# Patient Record
Sex: Male | Born: 1970 | Race: Black or African American | Hispanic: No | Smoking: Current every day smoker
Health system: Southern US, Community
[De-identification: ages and names within clinical notes are randomized; demographics above are authoritative.]

## PROBLEM LIST (undated history)

## (undated) DIAGNOSIS — I10 Essential (primary) hypertension: Secondary | ICD-10-CM

## (undated) HISTORY — PX: APPENDECTOMY: SHX54

---

## 2007-03-09 ENCOUNTER — Ambulatory Visit (HOSPITAL_COMMUNITY): Admission: RE | Admit: 2007-03-09 | Discharge: 2007-03-09 | Payer: Self-pay | Admitting: Orthopaedic Surgery

## 2013-01-29 ENCOUNTER — Emergency Department (HOSPITAL_COMMUNITY): Payer: Self-pay | Admitting: Anesthesiology

## 2013-01-29 ENCOUNTER — Encounter (HOSPITAL_COMMUNITY)
Admission: EM | Disposition: A | Discharge status: Home / Self Care | Payer: Self-pay | Source: Home / Self Care | Attending: Emergency Medicine

## 2013-01-29 ENCOUNTER — Encounter (HOSPITAL_COMMUNITY): Payer: Self-pay | Admitting: Anesthesiology

## 2013-01-29 ENCOUNTER — Emergency Department (HOSPITAL_COMMUNITY): Payer: Self-pay

## 2013-01-29 ENCOUNTER — Observation Stay (HOSPITAL_COMMUNITY)
Admission: EM | Admit: 2013-01-29 | Discharge: 2013-01-30 | Disposition: A | Discharge status: Home / Self Care | Payer: Self-pay | Attending: General Surgery | Admitting: General Surgery

## 2013-01-29 ENCOUNTER — Encounter (HOSPITAL_COMMUNITY): Payer: Self-pay | Admitting: Emergency Medicine

## 2013-01-29 DIAGNOSIS — K3580 Unspecified acute appendicitis: Principal | ICD-10-CM | POA: Insufficient documentation

## 2013-01-29 DIAGNOSIS — Z01812 Encounter for preprocedural laboratory examination: Secondary | ICD-10-CM | POA: Insufficient documentation

## 2013-01-29 DIAGNOSIS — K37 Unspecified appendicitis: Secondary | ICD-10-CM

## 2013-01-29 HISTORY — PX: LAPAROSCOPIC APPENDECTOMY: SHX408

## 2013-01-29 LAB — COMPREHENSIVE METABOLIC PANEL
Albumin: 3.7 g/dL (ref 3.5–5.2)
BUN: 9 mg/dL (ref 6–23)
Chloride: 98 mEq/L (ref 96–112)
Creatinine, Ser: 0.9 mg/dL (ref 0.50–1.35)
GFR calc Af Amer: >90 mL/min (ref >90)
Glucose, Bld: 98 mg/dL (ref 70–99)
Total Bilirubin: 0.6 mg/dL (ref 0.3–1.2)
Total Protein: 6.9 g/dL (ref 6.0–8.3)

## 2013-01-29 LAB — URINALYSIS, ROUTINE W REFLEX MICROSCOPIC
Bilirubin Urine: NEGATIVE
Ketones, ur: NEGATIVE mg/dL
Leukocytes, UA: NEGATIVE
Nitrite: NEGATIVE
Urobilinogen, UA: 0.2 mg/dL (ref 0.0–1.0)
pH: 6.5 (ref 5.0–8.0)

## 2013-01-29 LAB — CBC WITH DIFFERENTIAL/PLATELET
Basophils Relative: 1 % (ref 0–1)
Eosinophils Absolute: 0.1 10*3/uL (ref 0.0–0.7)
HCT: 44.8 % (ref 39.0–52.0)
Hemoglobin: 16.1 g/dL (ref 13.0–17.0)
Lymphs Abs: 1.5 10*3/uL (ref 0.7–4.0)
MCH: 29.7 pg (ref 26.0–34.0)
MCHC: 35.9 g/dL (ref 30.0–36.0)
Monocytes Absolute: 0.7 10*3/uL (ref 0.1–1.0)
Monocytes Relative: 8 % (ref 3–12)
RBC: 5.43 MIL/uL (ref 4.22–5.81)

## 2013-01-29 LAB — LIPASE, BLOOD: Lipase: 31 U/L (ref 11–59)

## 2013-01-29 LAB — URINE MICROSCOPIC-ADD ON

## 2013-01-29 SURGERY — APPENDECTOMY, LAPAROSCOPIC
Anesthesia: General | Site: Abdomen

## 2013-01-29 MED ORDER — ONDANSETRON HCL 4 MG/2ML IJ SOLN
INTRAMUSCULAR | Status: DC | PRN
  Administered 2013-01-29: 4 mg via INTRAVENOUS

## 2013-01-29 MED ORDER — MORPHINE SULFATE 4 MG/ML IJ SOLN
4.0000 mg | Freq: Once | INTRAMUSCULAR | Status: AC
  Administered 2013-01-29: 4 mg via INTRAVENOUS
  Filled 2013-01-29: qty 1

## 2013-01-29 MED ORDER — FENTANYL CITRATE 0.05 MG/ML IJ SOLN
INTRAMUSCULAR | Status: AC
  Filled 2013-01-29: qty 5

## 2013-01-29 MED ORDER — LACTATED RINGERS IV SOLN
INTRAVENOUS | Status: DC | PRN
  Administered 2013-01-29: 20:00:00 via INTRAVENOUS

## 2013-01-29 MED ORDER — MIDAZOLAM HCL 2 MG/2ML IJ SOLN
INTRAMUSCULAR | Status: AC
  Filled 2013-01-29: qty 2

## 2013-01-29 MED ORDER — ROCURONIUM BROMIDE 100 MG/10ML IV SOLN
INTRAVENOUS | Status: DC | PRN
  Administered 2013-01-29: 30 mg via INTRAVENOUS

## 2013-01-29 MED ORDER — ENOXAPARIN SODIUM 40 MG/0.4ML ~~LOC~~ SOLN
40.0000 mg | SUBCUTANEOUS | Status: DC
  Administered 2013-01-29: 40 mg via SUBCUTANEOUS
  Filled 2013-01-29: qty 0.4

## 2013-01-29 MED ORDER — IOHEXOL 300 MG/ML  SOLN
100.0000 mL | Freq: Once | INTRAMUSCULAR | Status: AC | PRN
  Administered 2013-01-29: 100 mL via INTRAVENOUS

## 2013-01-29 MED ORDER — METRONIDAZOLE IN NACL 5-0.79 MG/ML-% IV SOLN: 500.0000 mg | INTRAVENOUS | Status: DC

## 2013-01-29 MED ORDER — SIMETHICONE 80 MG PO CHEW: 80.0000 mg | CHEWABLE_TABLET | Freq: Four times a day (QID) | ORAL | Status: DC | PRN

## 2013-01-29 MED ORDER — ONDANSETRON HCL 4 MG/2ML IJ SOLN: 4.0000 mg | Freq: Four times a day (QID) | INTRAMUSCULAR | Status: DC | PRN

## 2013-01-29 MED ORDER — KETOROLAC TROMETHAMINE 30 MG/ML IJ SOLN
INTRAMUSCULAR | Status: AC
  Filled 2013-01-29: qty 1

## 2013-01-29 MED ORDER — CIPROFLOXACIN IN D5W 400 MG/200ML IV SOLN
INTRAVENOUS | Status: AC
  Filled 2013-01-29: qty 200

## 2013-01-29 MED ORDER — MIDAZOLAM HCL 5 MG/5ML IJ SOLN
INTRAMUSCULAR | Status: DC | PRN
  Administered 2013-01-29 (×2): 1 mg via INTRAVENOUS

## 2013-01-29 MED ORDER — POVIDONE-IODINE 10 % EX OINT
TOPICAL_OINTMENT | CUTANEOUS | Status: AC
  Filled 2013-01-29: qty 1

## 2013-01-29 MED ORDER — GLYCOPYRROLATE 0.2 MG/ML IJ SOLN
INTRAMUSCULAR | Status: AC
  Filled 2013-01-29: qty 2

## 2013-01-29 MED ORDER — ONDANSETRON HCL 4 MG PO TABS: 4.0000 mg | ORAL_TABLET | Freq: Four times a day (QID) | ORAL | Status: DC | PRN

## 2013-01-29 MED ORDER — GLYCOPYRROLATE 0.2 MG/ML IJ SOLN
INTRAMUSCULAR | Status: DC | PRN
  Administered 2013-01-29: 0.2 mg via INTRAVENOUS
  Administered 2013-01-29: 0.4 mg via INTRAVENOUS

## 2013-01-29 MED ORDER — GLYCOPYRROLATE 0.2 MG/ML IJ SOLN
INTRAMUSCULAR | Status: AC
  Filled 2013-01-29: qty 1

## 2013-01-29 MED ORDER — CIPROFLOXACIN IN D5W 400 MG/200ML IV SOLN: 400.0000 mg | INTRAVENOUS | Status: DC

## 2013-01-29 MED ORDER — POVIDONE-IODINE 10 % OINT PACKET
TOPICAL_OINTMENT | CUTANEOUS | Status: DC | PRN
  Administered 2013-01-29: 1 via TOPICAL

## 2013-01-29 MED ORDER — LACTATED RINGERS IV SOLN
INTRAVENOUS | Status: DC
  Administered 2013-01-29: 23:00:00 via INTRAVENOUS

## 2013-01-29 MED ORDER — METRONIDAZOLE IN NACL 5-0.79 MG/ML-% IV SOLN
500.0000 mg | Freq: Three times a day (TID) | INTRAVENOUS | Status: DC
  Administered 2013-01-30: 500 mg via INTRAVENOUS
  Filled 2013-01-29 (×2): qty 100

## 2013-01-29 MED ORDER — LIDOCAINE HCL (PF) 1 % IJ SOLN
INTRAMUSCULAR | Status: AC
  Filled 2013-01-29: qty 5

## 2013-01-29 MED ORDER — BUPIVACAINE HCL (PF) 0.5 % IJ SOLN
INTRAMUSCULAR | Status: DC | PRN
  Administered 2013-01-29: 10 mL

## 2013-01-29 MED ORDER — FENTANYL CITRATE 0.05 MG/ML IJ SOLN
INTRAMUSCULAR | Status: DC | PRN
  Administered 2013-01-29 (×2): 50 ug via INTRAVENOUS
  Administered 2013-01-29: 25 ug via INTRAVENOUS
  Administered 2013-01-29: 50 ug via INTRAVENOUS
  Administered 2013-01-29: 25 ug via INTRAVENOUS
  Administered 2013-01-29: 50 ug via INTRAVENOUS

## 2013-01-29 MED ORDER — 0.9 % SODIUM CHLORIDE (POUR BTL) OPTIME
TOPICAL | Status: DC | PRN
  Administered 2013-01-29: 1000 mL

## 2013-01-29 MED ORDER — KETOROLAC TROMETHAMINE 30 MG/ML IJ SOLN
30.0000 mg | Freq: Once | INTRAMUSCULAR | Status: AC
  Administered 2013-01-29: 30 mg via INTRAVENOUS

## 2013-01-29 MED ORDER — CIPROFLOXACIN IN D5W 400 MG/200ML IV SOLN
400.0000 mg | Freq: Two times a day (BID) | INTRAVENOUS | Status: DC
  Administered 2013-01-30: 400 mg via INTRAVENOUS
  Filled 2013-01-29: qty 200

## 2013-01-29 MED ORDER — ROCURONIUM BROMIDE 50 MG/5ML IV SOLN
INTRAVENOUS | Status: AC
  Filled 2013-01-29: qty 1

## 2013-01-29 MED ORDER — CIPROFLOXACIN IN D5W 400 MG/200ML IV SOLN
INTRAVENOUS | Status: AC
  Administered 2013-01-29: 400 mg
  Filled 2013-01-29: qty 200

## 2013-01-29 MED ORDER — PROPOFOL 10 MG/ML IV BOLUS
INTRAVENOUS | Status: DC | PRN
  Administered 2013-01-29: 140 mg via INTRAVENOUS

## 2013-01-29 MED ORDER — NEOSTIGMINE METHYLSULFATE 1 MG/ML IJ SOLN
INTRAMUSCULAR | Status: DC | PRN
  Administered 2013-01-29: 2 mg via INTRAVENOUS

## 2013-01-29 MED ORDER — IOHEXOL 300 MG/ML  SOLN
50.0000 mL | Freq: Once | INTRAMUSCULAR | Status: AC | PRN
  Administered 2013-01-29: 50 mL via ORAL

## 2013-01-29 MED ORDER — OXYCODONE-ACETAMINOPHEN 5-325 MG PO TABS
1.0000 | ORAL_TABLET | ORAL | Status: DC | PRN
  Administered 2013-01-30: 1 via ORAL
  Filled 2013-01-29: qty 1

## 2013-01-29 MED ORDER — HYDROMORPHONE HCL PF 1 MG/ML IJ SOLN
1.0000 mg | INTRAMUSCULAR | Status: DC | PRN
Start: 2013-01-29 — End: 2013-01-30

## 2013-01-29 MED ORDER — LIDOCAINE HCL (CARDIAC) 10 MG/ML IV SOLN
INTRAVENOUS | Status: DC | PRN
  Administered 2013-01-29: 10 mg via INTRAVENOUS

## 2013-01-29 MED ORDER — METRONIDAZOLE IN NACL 5-0.79 MG/ML-% IV SOLN
INTRAVENOUS | Status: AC
  Administered 2013-01-29: 1000 mg
  Filled 2013-01-29: qty 100

## 2013-01-29 MED ORDER — METRONIDAZOLE IN NACL 5-0.79 MG/ML-% IV SOLN
INTRAVENOUS | Status: AC
  Filled 2013-01-29: qty 100

## 2013-01-29 MED ORDER — BUPIVACAINE HCL (PF) 0.5 % IJ SOLN
INTRAMUSCULAR | Status: AC
  Filled 2013-01-29: qty 30

## 2013-01-29 MED ORDER — ACETAMINOPHEN 500 MG PO TABS
1000.0000 mg | ORAL_TABLET | Freq: Four times a day (QID) | ORAL | Status: DC
  Administered 2013-01-29 – 2013-01-30 (×2): 1000 mg via ORAL
  Filled 2013-01-29 (×2): qty 2

## 2013-01-29 MED ORDER — PROPOFOL 10 MG/ML IV EMUL
INTRAVENOUS | Status: AC
  Filled 2013-01-29: qty 20

## 2013-01-29 MED ORDER — ONDANSETRON HCL 4 MG/2ML IJ SOLN
INTRAMUSCULAR | Status: AC
  Filled 2013-01-29: qty 2

## 2013-01-29 MED ORDER — ONDANSETRON HCL 4 MG/2ML IJ SOLN
4.0000 mg | Freq: Once | INTRAMUSCULAR | Status: AC
  Administered 2013-01-29: 4 mg via INTRAVENOUS
  Filled 2013-01-29: qty 2

## 2013-01-29 SURGICAL SUPPLY — 46 items
BAG HAMPER (MISCELLANEOUS) ×2 IMPLANT
CLOTH BEACON ORANGE TIMEOUT ST (SAFETY) ×2 IMPLANT
COVER LIGHT HANDLE STERIS (MISCELLANEOUS) ×4 IMPLANT
CUTTER FLEX LINEAR 45M (STAPLE) IMPLANT
CUTTER LINEAR ENDO 35 ETS (STAPLE) IMPLANT
CUTTER LINEAR ENDO 35 ETS TH (STAPLE) ×2 IMPLANT
DECANTER SPIKE VIAL GLASS SM (MISCELLANEOUS) ×2 IMPLANT
DISSECTOR BLUNT TIP ENDO 5MM (MISCELLANEOUS) IMPLANT
DURAPREP 26ML APPLICATOR (WOUND CARE) ×2 IMPLANT
ELECT REM PT RETURN 9FT ADLT (ELECTROSURGICAL) ×2
ELECTRODE REM PT RTRN 9FT ADLT (ELECTROSURGICAL) ×1 IMPLANT
FILTER SMOKE EVAC LAPAROSHD (FILTER) ×2 IMPLANT
FORMALIN 10 PREFIL 120ML (MISCELLANEOUS) ×2 IMPLANT
GLOVE BIO SURGEON STRL SZ7.5 (GLOVE) ×2 IMPLANT
GLOVE BIOGEL PI IND STRL 7.0 (GLOVE) ×2 IMPLANT
GLOVE BIOGEL PI INDICATOR 7.0 (GLOVE) ×2
GLOVE EXAM NITRILE MD LF STRL (GLOVE) ×2 IMPLANT
GLOVE OPTIFIT SS 6.5 STRL BRWN (GLOVE) ×2 IMPLANT
GOWN STRL REIN XL XLG (GOWN DISPOSABLE) ×4 IMPLANT
INST SET LAPROSCOPIC AP (KITS) ×2 IMPLANT
IV NS IRRIG 3000ML ARTHROMATIC (IV SOLUTION) IMPLANT
KIT ROOM TURNOVER APOR (KITS) ×2 IMPLANT
MANIFOLD NEPTUNE II (INSTRUMENTS) ×2 IMPLANT
NEEDLE INSUFFLATION 14GA 120MM (NEEDLE) ×2 IMPLANT
NS IRRIG 1000ML POUR BTL (IV SOLUTION) ×2 IMPLANT
PACK LAP CHOLE LZT030E (CUSTOM PROCEDURE TRAY) ×2 IMPLANT
PAD ARMBOARD 7.5X6 YLW CONV (MISCELLANEOUS) ×2 IMPLANT
PENCIL HANDSWITCHING (ELECTRODE) ×2 IMPLANT
POUCH SPECIMEN RETRIEVAL 10MM (ENDOMECHANICALS) ×2 IMPLANT
RELOAD /EVU35 (ENDOMECHANICALS) IMPLANT
RELOAD 45 VASCULAR/THIN (ENDOMECHANICALS) IMPLANT
RELOAD CUTTER ETS 35MM STAND (ENDOMECHANICALS) IMPLANT
SCALPEL HARMONIC ACE (MISCELLANEOUS) ×2 IMPLANT
SET BASIN LINEN APH (SET/KITS/TRAYS/PACK) ×2 IMPLANT
SET TUBE IRRIG SUCTION NO TIP (IRRIGATION / IRRIGATOR) IMPLANT
SPONGE GAUZE 2X2 8PLY STRL LF (GAUZE/BANDAGES/DRESSINGS) ×2 IMPLANT
STAPLER VISISTAT (STAPLE) ×2 IMPLANT
SUT VICRYL 0 UR6 27IN ABS (SUTURE) ×2 IMPLANT
TAPE CLOTH SURG 4X10 WHT LF (GAUZE/BANDAGES/DRESSINGS) ×2 IMPLANT
TRAY FOLEY CATH 16FR SILVER (SET/KITS/TRAYS/PACK) ×2 IMPLANT
TROCAR ENDO BLADELESS 11MM (ENDOMECHANICALS) ×2 IMPLANT
TROCAR ENDO BLADELESS 12MM (ENDOMECHANICALS) ×2 IMPLANT
TROCAR XCEL NON-BLD 5MMX100MML (ENDOMECHANICALS) ×2 IMPLANT
TUBING INSUFFLATION (TUBING) ×2 IMPLANT
WARMER LAPAROSCOPE (MISCELLANEOUS) ×2 IMPLANT
YANKAUER SUCT 12FT TUBE ARGYLE (SUCTIONS) ×2 IMPLANT

## 2013-01-29 NOTE — ED Provider Notes (Signed)
CSN: [id number]     Arrival date & time 01/29/13  1617 History   First MD Initiated Contact with Patient 01/29/13 1637     Chief Complaint  Patient presents with  . Abdominal Pain   (Consider location/radiation/quality/duration/timing/severity/associated sxs/prior Treatment) HPI Comments: Patient presents to the ER for evaluation of abdominal pain. Patient reports that the pain began 2 days ago. Initially it was diffuse in the upper. He reports that he felt very bloated and has been taking gas relief medication without improvement. No nausea, vomiting or diarrhea. He has not had a normal bowel movement in several days. Patient denies fever. Patient reports that the pain worsened today and now is more in the lower abdomen. It is constant and moderate to severe. He has not identified alleviating or exacerbating factors.  Patient is a 42 y.o. male presenting with abdominal pain.  Abdominal Pain Associated symptoms: no fever     History reviewed. No pertinent past medical history. History reviewed. No pertinent past surgical history. No family history on file. History  Substance Use Topics  . Smoking status: Current Every Day Smoker  . Smokeless tobacco: Not on file  . Alcohol Use: No    Review of Systems  Constitutional: Negative for fever.  Gastrointestinal: Positive for abdominal pain and abdominal distention.  All other systems reviewed and are negative.    Allergies  Penicillins  Home Medications   Current Outpatient Rx  Name  Route  Sig  Dispense  Refill  . simethicone (GAS RELIEF EXTRA STRENGTH) 125 MG chewable tablet   Oral   Chew 125 mg by mouth every 6 (six) hours as needed for flatulence (and/or stomach pain).          BP 123/84  Pulse 94  Temp(Src) 98.3 F (36.8 C) (Oral)  Resp 20  Ht 5\' 7"  (1.702 m)  Wt 140 lb (63.504 kg)  BMI 21.92 kg/m2  SpO2 100% Physical Exam  Constitutional: He is oriented to person, place, and time. He appears well-developed and  well-nourished. No distress.  HENT:  Head: Normocephalic and atraumatic.  Right Ear: Hearing normal.  Left Ear: Hearing normal.  Nose: Nose normal.  Mouth/Throat: Oropharynx is clear and moist and mucous membranes are normal.  Eyes: Conjunctivae and EOM are normal. Pupils are equal, round, and reactive to light.  Neck: Normal range of motion. Neck supple.  Cardiovascular: Regular rhythm, S1 normal and S2 normal.  Exam reveals no gallop and no friction rub.   No murmur heard. Pulmonary/Chest: Effort normal and breath sounds normal. No respiratory distress. He exhibits no tenderness.  Abdominal: Soft. Normal appearance and bowel sounds are normal. There is no hepatosplenomegaly. There is tenderness. There is no rebound, no guarding, no tenderness at McBurney's point and negative Murphy's sign. No hernia.  Tenderness is diffuse, the patient has increased tenderness to palpation in the right lower quadrant  Musculoskeletal: Normal range of motion.  Neurological: He is alert and oriented to person, place, and time. He has normal strength. No cranial nerve deficit or sensory deficit. Coordination normal. GCS eye subscore is 4. GCS verbal subscore is 5. GCS motor subscore is 6.  Skin: Skin is warm, dry and intact. No rash noted. No cyanosis.  Psychiatric: He has a normal mood and affect. His speech is normal and behavior is normal. Thought content normal.    ED Course  Procedures (including critical care time) Labs Review Labs Reviewed  URINALYSIS, ROUTINE W REFLEX MICROSCOPIC - Abnormal; Notable for the following:  Hgb urine dipstick TRACE (*)    All other components within normal limits  CBC WITH DIFFERENTIAL  COMPREHENSIVE METABOLIC PANEL  LIPASE, BLOOD  URINE MICROSCOPIC-ADD ON   Imaging Review Ct Abdomen Pelvis W Contrast  01/29/2013   CLINICAL DATA:  Abdominal pain, right lower quadrant pain.  EXAM: CT ABDOMEN AND PELVIS WITH CONTRAST  TECHNIQUE: Multidetector CT imaging of the  abdomen and pelvis was performed using the standard protocol following bolus administration of intravenous contrast.  CONTRAST:  50mL OMNIPAQUE IOHEXOL 300 MG/ML SOLN, OMNIPAQUE IOHEXOL 300 MG/ML SOLN  COMPARISON:  None.  FINDINGS: Lung bases are clear. No effusions. Heart is normal size.  Liver, gallbladder, spleen, pancreas, adrenals and kidneys are unremarkable.  The appendix is dilated with mucosal enhancement. Diameter is approximately 10 mm. Findings compatible with acute appendicitis. The cecum is mildly dilated with air and located near the midline underlying the umbilicus. Terminal ileum unremarkable. No free fluid, free air or adenopathy. Urinary bladder and prostate grossly unremarkable. Aorta is normal caliber.  No acute bony abnormality.  IMPRESSION: Dilated appendix with mucosal enhancement compatible with acute appendicitis.   Electronically Signed   By: Charlett Nose M.D.   On: 01/29/2013 18:44    EKG Interpretation   None       MDM  Diagnosis: Acute Appendicitis  Patient presents to the ER for evaluation of abdominal pain for two days. It started as upper and S., has become worse today. He did have some mild diffuse abdominal tenderness, but did have focal tenderness and guarding in the right lower quadrant. Labs were unremarkable. CT scan was performed and does show dilated appendix with mucosal enhancement consistent with acute appendicitis. Consult to Doctor Lovell Sheehan to General surgery for admission and surgery.    Gilda Crease, MD 01/29/13 [phone]

## 2013-01-29 NOTE — Transfer of Care (Signed)
Immediate Anesthesia Transfer of Care Note  Patient: Casey Yang  Procedure(s) Performed: Procedure(s) (LRB): APPENDECTOMY LAPAROSCOPIC (N/A)  Patient Location: PACU  Anesthesia Type: General  Level of Consciousness: awake  Airway & Oxygen Therapy: Patient Spontanous Breathing and non-rebreather face mask  Post-op Assessment: Report given to PACU RN, Post -op Vital signs reviewed and stable and Patient moving all extremities  Post vital signs: Reviewed and stable  Complications: No apparent anesthesia complications

## 2013-01-29 NOTE — Anesthesia Preprocedure Evaluation (Signed)
Anesthesia Evaluation  Patient identified by MRN, date of birth, ID band Patient awake    Reviewed: Allergy & Precautions, H&P , NPO status , Patient's Chart, lab work & pertinent test results  Airway Mallampati: II TM Distance: >3 FB Neck ROM: Full    Dental  (+) Teeth Intact and Missing   Pulmonary    Pulmonary exam normal       Cardiovascular Exercise Tolerance: Good Rhythm:Regular     Neuro/Psych    GI/Hepatic Patient received Oral Contrast Agents,Right lower quadrant pain x 48 hours   Endo/Other    Renal/GU      Musculoskeletal   Abdominal Normal abdominal exam  (+)   Peds  Hematology   Anesthesia Other Findings   Reproductive/Obstetrics                           Anesthesia Physical Anesthesia Plan  ASA: I and emergent  Anesthesia Plan: General   Post-op Pain Management:    Induction: Intravenous, Rapid sequence and Cricoid pressure planned  Airway Management Planned: Oral ETT  Additional Equipment:   Intra-op Plan:   Post-operative Plan: Extubation in OR  Informed Consent:   Dental advisory given  Plan Discussed with: Anesthesiologist and Surgeon  Anesthesia Plan Comments:         Anesthesia Quick Evaluation

## 2013-01-29 NOTE — ED Notes (Signed)
Patient to or

## 2013-01-29 NOTE — H&P (Signed)
Casey Yang is an 42 y.o. male.   Chief Complaint: Abdominal pain HPI: Patient is a 42 year old white male who presents with a two-day history of worsening abdominal pain. CT scan the abdomen reveals acute appendicitis.  History reviewed. No pertinent past medical history.  History reviewed. No pertinent past surgical history.  No family history on file. Social History:  reports that he has been smoking.  He does not have any smokeless tobacco history on file. He reports that he does not drink alcohol or use illicit drugs.  Allergies:  Allergies  Allergen Reactions  . Penicillins Nausea And Vomiting     (Not in a hospital admission)  Results for orders placed during the hospital encounter of 01/29/13 (from the past 48 hour(s))  CBC WITH DIFFERENTIAL     Status: None   Collection Time    01/29/13  5:16 PM      Result Value Range   WBC 8.8  4.0 - 10.5 K/uL   RBC 5.43  4.22 - 5.81 MIL/uL   Hemoglobin 16.1  13.0 - 17.0 g/dL   HCT 16.1  09.6 - 04.5 %   MCV 82.5  78.0 - 100.0 fL   MCH 29.7  26.0 - 34.0 pg   MCHC 35.9  30.0 - 36.0 g/dL   RDW 40.9  81.1 - 91.4 %   Platelets 182  150 - 400 K/uL   Neutrophils Relative % 73  43 - 77 %   Neutro Abs 6.4  1.7 - 7.7 K/uL   Lymphocytes Relative 17  12 - 46 %   Lymphs Abs 1.5  0.7 - 4.0 K/uL   Monocytes Relative 8  3 - 12 %   Monocytes Absolute 0.7  0.1 - 1.0 K/uL   Eosinophils Relative 2  0 - 5 %   Eosinophils Absolute 0.1  0.0 - 0.7 K/uL   Basophils Relative 1  0 - 1 %   Basophils Absolute 0.1  0.0 - 0.1 K/uL  COMPREHENSIVE METABOLIC PANEL     Status: None   Collection Time    01/29/13  5:16 PM      Result Value Range   Sodium 139  135 - 145 mEq/L   Potassium 4.0  3.5 - 5.1 mEq/L   Chloride 98  96 - 112 mEq/L   CO2 26  19 - 32 mEq/L   Glucose, Bld 98  70 - 99 mg/dL   BUN 9  6 - 23 mg/dL   Creatinine, Ser 7.82  0.50 - 1.35 mg/dL   Calcium 9.6  8.4 - 95.6 mg/dL   Total Protein 6.9  6.0 - 8.3 g/dL   Albumin 3.7  3.5 - 5.2  g/dL   AST 28  0 - 37 U/L   Comment: SLIGHT HEMOLYSIS   ALT 19  0 - 53 U/L   Alkaline Phosphatase 64  39 - 117 U/L   Total Bilirubin 0.6  0.3 - 1.2 mg/dL   GFR calc non Af Amer >90  >90 mL/min   GFR calc Af Amer >90  >90 mL/min   Comment: (NOTE)     The eGFR has been calculated using the CKD EPI equation.     This calculation has not been validated in all clinical situations.     eGFR's persistently <90 mL/min signify possible Chronic Kidney     Disease.  LIPASE, BLOOD     Status: None   Collection Time    01/29/13  5:16 PM  Result Value Range   Lipase 31  11 - 59 U/L  URINALYSIS, ROUTINE W REFLEX MICROSCOPIC     Status: Abnormal   Collection Time    01/29/13  5:21 PM      Result Value Range   Color, Urine YELLOW  YELLOW   APPearance CLEAR  CLEAR   Specific Gravity, Urine 1.010  1.005 - 1.030   pH 6.5  5.0 - 8.0   Glucose, UA NEGATIVE  NEGATIVE mg/dL   Hgb urine dipstick TRACE (*) NEGATIVE   Bilirubin Urine NEGATIVE  NEGATIVE   Ketones, ur NEGATIVE  NEGATIVE mg/dL   Protein, ur NEGATIVE  NEGATIVE mg/dL   Urobilinogen, UA 0.2  0.0 - 1.0 mg/dL   Nitrite NEGATIVE  NEGATIVE   Leukocytes, UA NEGATIVE  NEGATIVE  URINE MICROSCOPIC-ADD ON     Status: None   Collection Time    01/29/13  5:21 PM      Result Value Range   Squamous Epithelial / LPF RARE  RARE   WBC, UA 0-2  <3 WBC/hpf   RBC / HPF 0-2  <3 RBC/hpf   Bacteria, UA RARE  RARE   Ct Abdomen Pelvis W Contrast  01/29/2013   CLINICAL DATA:  Abdominal pain, right lower quadrant pain.  EXAM: CT ABDOMEN AND PELVIS WITH CONTRAST  TECHNIQUE: Multidetector CT imaging of the abdomen and pelvis was performed using the standard protocol following bolus administration of intravenous contrast.  CONTRAST:  50mL OMNIPAQUE IOHEXOL 300 MG/ML SOLN, OMNIPAQUE IOHEXOL 300 MG/ML SOLN  COMPARISON:  None.  FINDINGS: Lung bases are clear. No effusions. Heart is normal size.  Liver, gallbladder, spleen, pancreas, adrenals and kidneys are  unremarkable.  The appendix is dilated with mucosal enhancement. Diameter is approximately 10 mm. Findings compatible with acute appendicitis. The cecum is mildly dilated with air and located near the midline underlying the umbilicus. Terminal ileum unremarkable. No free fluid, free air or adenopathy. Urinary bladder and prostate grossly unremarkable. Aorta is normal caliber.  No acute bony abnormality.  IMPRESSION: Dilated appendix with mucosal enhancement compatible with acute appendicitis.   Electronically Signed   By: Charlett Nose M.D.   On: 01/29/2013 18:44    Review of Systems  Constitutional: Positive for malaise/fatigue.  Respiratory: Negative.   Cardiovascular: Negative.   Gastrointestinal: Positive for abdominal pain.  Genitourinary: Negative.   Musculoskeletal: Negative.   All other systems reviewed and are negative.    Blood pressure 140/94, pulse 82, temperature 98.6 F (37 C), temperature source Oral, resp. rate 18, height 5\' 7"  (1.702 m), weight 63.504 kg (140 lb), SpO2 100.00%. Physical Exam  Vitals reviewed. Constitutional: He appears well-developed and well-nourished.  HENT:  Head: Normocephalic and atraumatic.  Neck: Normal range of motion. Neck supple.  Cardiovascular: Normal rate, regular rhythm and normal heart sounds.   Respiratory: Effort normal and breath sounds normal.  GI: Soft. There is tenderness. There is no rebound.  Tender in the right lower corner to deep palpation. No rigidity noted.  Neurological: He is alert.  Skin: Skin is warm and dry.     Assessment/Plan Impression: Acute appendicitis Plan: Patient be taken to the operating room for laparoscopic appendectomy. The risks and benefits of the procedure including bleeding, infection, and the possibility of an open procedure were fully explained to the patient, who gave informed consent.  Macklin Jacquin A 01/29/2013, 7:53 PM

## 2013-01-29 NOTE — Anesthesia Postprocedure Evaluation (Signed)
Anesthesia Post Note  Patient: Casey Yang  Procedure(s) Performed: Procedure(s) (LRB): APPENDECTOMY LAPAROSCOPIC (N/A)  Anesthesia type: General  Patient location: PACU  Post pain: Pain level controlled  Post assessment: Post-op Vital signs reviewed, Patient's Cardiovascular Status Stable, Respiratory Function Stable, Patent Airway, No signs of Nausea or vomiting and Pain level controlled  Last Vitals:  Filed Vitals:   01/29/13 2130  BP: 128/76  Pulse: 61  Temp:   Resp: 10    Post vital signs: Reviewed and stable  Level of consciousness: awake and alert   Complications: No apparent anesthesia complications

## 2013-01-29 NOTE — Op Note (Signed)
Patient:  Casey Yang  DOB:  07/28/1970  MRN:  [id number]   Preop Diagnosis:  Acute appendicitis  Postop Diagnosis:  Same  Procedure:  Laparoscopic appendectomy  Surgeon:  Franky Macho, M.D.  Anes:  General endotracheal  Indications:  Patient is a 42 year old black male who presents with acute appendicitis. The risks and benefits of the procedure including bleeding, infection, and the possibility of an open procedure were fully explained to the patient, who gave informed consent.  Procedure note:  The patient is placed the supine position. After induction of general endotracheal anesthesia, the abdomen was prepped and draped using usual sterile technique with DuraPrep. Surgical site confirmation was performed.  A supraumbilical incision was made down to the fascia. A Veress needle was introduced into the abdominal cavity and confirmation of placement was done using the saline drop test. The abdomen was then insufflated to 16 mm mercury pressure. An 11 mm trocar was introduced into the abdominal cavity under direct visualization without difficulty. Patient was placed in deeper Trendelenburg position and an additional 12 mm trocar was placed the suprapubic region. A 5 mm trocar was placed left lower corner region. The appendix was visualized and noted to be inflamed. The descending colon was noted to be mildly dilated as was seen on CAT scan. The mesoappendix was divided using the harmonic scalpel. A vascular Endo GIA was placed across the base the appendix and fired. The appendix was then removed using an Endo Catch bag and sent to pathology further examination. The staple line was inspected and noted within normal limits. The descending colon was inspected and there was no evidence of injury. All fluid and air were then evacuated from the abdominal cavity prior to removal of the trochars.  All wounds were irrigated with normal saline. Of note was the fact that there was subcutaneous air  present around the umbilical trocar site where air was insufflated. This was expressed through the trocar sites. All wounds were injected with 0.5% Sensorcaine. The supraumbilical fascia as well as suprapubic fascia were reapproximated using 0 Vicryl interrupted sutures. All skin incisions were closed using staples. Betadine ointment and dry sterile dressings were applied.  All tape and needle counts were correct at the end of the procedure. Patient was extubated in the operating room and transferred to PACU in stable condition.  Complications:  None  EBL:  Minimal  Specimen:  Appendix

## 2013-01-29 NOTE — ED Notes (Signed)
Pt c/o abd pain since Sunday evening.  Denies n/v/d.  Reports has been taking otc gas relief tablets.  LBM was 2 or 3 days ago.

## 2013-01-29 NOTE — Anesthesia Procedure Notes (Signed)
Procedure Name: Intubation Date/Time: 01/29/2013 8:22 PM Performed by: Franco Nones Pre-anesthesia Checklist: Patient identified, Patient being monitored, Timeout performed, Emergency Drugs available and Suction available Patient Re-evaluated:Patient Re-evaluated prior to inductionOxygen Delivery Method: Circle System Utilized Preoxygenation: Pre-oxygenation with 100% oxygen Intubation Type: IV induction, Rapid sequence and Cricoid Pressure applied Ventilation: Mask ventilation without difficulty Laryngoscope Size: Miller and 2 Grade View: Grade I Tube type: Oral Tube size: 7.0 mm Number of attempts: 1 Airway Equipment and Method: stylet Placement Confirmation: ETT inserted through vocal cords under direct vision,  positive ETCO2 and breath sounds checked- equal and bilateral Secured at: 21 cm Tube secured with: Tape Dental Injury: Teeth and Oropharynx as per pre-operative assessment

## 2013-01-30 ENCOUNTER — Encounter (HOSPITAL_COMMUNITY): Payer: Self-pay | Admitting: General Surgery

## 2013-01-30 LAB — BASIC METABOLIC PANEL
BUN: 9 mg/dL (ref 6–23)
CO2: 32 mEq/L (ref 19–32)
Calcium: 8.5 mg/dL (ref 8.4–10.5)
Chloride: 99 mEq/L (ref 96–112)
Creatinine, Ser: 1.16 mg/dL (ref 0.50–1.35)
Sodium: 138 mEq/L (ref 135–145)

## 2013-01-30 LAB — CBC
HCT: 41 % (ref 39.0–52.0)
MCH: 29.1 pg (ref 26.0–34.0)
MCHC: 34.4 g/dL (ref 30.0–36.0)
MCV: 84.5 fL (ref 78.0–100.0)
Platelets: 159 10*3/uL (ref 150–400)
RBC: 4.85 MIL/uL (ref 4.22–5.81)
RDW: 14.1 % (ref 11.5–15.5)

## 2013-01-30 MED ORDER — OXYCODONE-ACETAMINOPHEN 7.5-325 MG PO TABS: 1.0000 | ORAL_TABLET | ORAL | Status: DC | PRN

## 2013-01-30 NOTE — Discharge Summary (Signed)
Physician Discharge Summary  Patient ID: Casey Yang MRN: [id number] DOB/AGE: 02-11-1970 42 y.o.  Admit date: 01/29/2013 Discharge date: 01/30/2013  Admission Diagnoses: Acute appendicitis  Discharge Diagnoses: Same Active Problems:   Acute appendicitis   Discharged Condition: good  Hospital Course: Patient is a 42 year old black male who presented emergency room with a two-day history of lower abdominal pain. CT scan the abdomen revealed acute appendicitis. He was taken to the operating room on 01/29/2013 and underwent laparoscopic appendectomy. Tolerated the procedure well. His postoperative course has been unremarkable. His diet was advanced without difficulty. He is being discharged home on postoperative day one in good improving condition.  Treatments: surgery: Laparoscopic appendectomy on 01/29/2013  Discharge Exam: Blood pressure 107/80, pulse 61, temperature 97.8 F (36.6 C), temperature source Oral, resp. rate 19, height 5\' 7"  (1.702 m), weight 63.504 kg (140 lb), SpO2 100.00%. General appearance: alert, cooperative and no distress Resp: clear to auscultation bilaterally Cardio: regular rate and rhythm, S1, S2 normal, no murmur, click, rub or gallop GI: Soft. Dressings dry and intact.  Disposition: Home    Medication List         GAS RELIEF EXTRA STRENGTH 125 MG chewable tablet  Generic drug:  simethicone  Chew 125 mg by mouth every 6 (six) hours as needed for flatulence (and/or stomach pain).     oxyCODONE-acetaminophen 7.5-325 MG per tablet  Commonly known as:  PERCOCET  Take 1-2 tablets by mouth every 4 (four) hours as needed.           Follow-up Information   Follow up with Dalia Heading, MD. Schedule an appointment as soon as possible for a visit on 02/06/2013.   Specialty:  General Surgery   Contact information:   [address] [place] [address] [phone]       Signed: Franky Macho A 01/30/2013, 8:09 AM

## 2013-01-30 NOTE — Progress Notes (Signed)
Patient d/c home with prescriptions and work note. IV cath removed and intact. No pain/swelling at site. No drainage/ bleeding from puncture sites. Patient ambulatory and awaiting for ride home.

## 2015-05-16 ENCOUNTER — Emergency Department (HOSPITAL_COMMUNITY)
Admission: EM | Admit: 2015-05-16 | Discharge: 2015-05-16 | Disposition: A | Discharge status: Home / Self Care | Payer: Self-pay | Attending: Emergency Medicine | Admitting: Emergency Medicine

## 2015-05-16 ENCOUNTER — Encounter (HOSPITAL_COMMUNITY): Payer: Self-pay | Admitting: Emergency Medicine

## 2015-05-16 DIAGNOSIS — F17210 Nicotine dependence, cigarettes, uncomplicated: Secondary | ICD-10-CM | POA: Insufficient documentation

## 2015-05-16 DIAGNOSIS — L259 Unspecified contact dermatitis, unspecified cause: Secondary | ICD-10-CM | POA: Insufficient documentation

## 2015-05-16 DIAGNOSIS — L237 Allergic contact dermatitis due to plants, except food: Secondary | ICD-10-CM

## 2015-05-16 MED ORDER — PREDNISONE 50 MG PO TABS
60.0000 mg | ORAL_TABLET | Freq: Once | ORAL | Status: AC
  Administered 2015-05-16: 60 mg via ORAL
  Filled 2015-05-16: qty 1

## 2015-05-16 MED ORDER — ONDANSETRON HCL 4 MG PO TABS
4.0000 mg | ORAL_TABLET | Freq: Once | ORAL | Status: AC
  Administered 2015-05-16: 4 mg via ORAL
  Filled 2015-05-16: qty 1

## 2015-05-16 MED ORDER — TRIAMCINOLONE ACETONIDE 0.1 % EX CREA: 1.0000 "application " | TOPICAL_CREAM | Freq: Two times a day (BID) | CUTANEOUS | Status: DC

## 2015-05-16 MED ORDER — DIPHENHYDRAMINE HCL 25 MG PO TABS
ORAL_TABLET | ORAL | Status: DC
Start: 2015-05-16 — End: 2017-12-27

## 2015-05-16 MED ORDER — DEXAMETHASONE 4 MG PO TABS: 4.0000 mg | ORAL_TABLET | Freq: Two times a day (BID) | ORAL | Status: DC

## 2015-05-16 MED ORDER — LORATADINE 10 MG PO TABS
10.0000 mg | ORAL_TABLET | Freq: Once | ORAL | Status: AC
  Administered 2015-05-16: 10 mg via ORAL
  Filled 2015-05-16: qty 1

## 2015-05-16 NOTE — ED Notes (Signed)
PT c/o poison oak reported rash x2 days after contact with plant.

## 2015-05-16 NOTE — ED Provider Notes (Signed)
CSN: [id number]     Arrival date & time 05/16/15  1639 History   First MD Initiated Contact with Patient 05/16/15 1725     Chief Complaint  Patient presents with  . Poison Oak     (Consider location/radiation/quality/duration/timing/severity/associated sxs/prior Treatment) Patient is a 45 y.o. male presenting with rash. The history is provided by the patient.  Rash Location:  Hand and shoulder/arm Shoulder/arm rash location:  L arm and R arm Hand rash location:  R hand and L hand Quality: blistering and itchiness   Quality: not draining   Severity:  Moderate Onset quality:  Gradual Duration:  2 days Timing:  Intermittent Progression:  Unchanged Context: plant contact   Relieved by:  Nothing Ineffective treatments:  None tried Associated symptoms: no abdominal pain, no joint pain, no shortness of breath, no sore throat, no throat swelling, no tongue swelling, not vomiting and not wheezing     History reviewed. No pertinent past medical history. Past Surgical History  Procedure Laterality Date  . Laparoscopic appendectomy N/A 01/29/2013    Procedure: APPENDECTOMY LAPAROSCOPIC;  Surgeon: Dalia HeadingMark A Jenkins, MD;  Location: [facility];  Service: General;  Laterality: N/A;  . Appendectomy     History reviewed. No pertinent family history. Social History  Substance Use Topics  . Smoking status: Current Every Day Smoker -- 1.00 packs/day    Types: Cigarettes  . Smokeless tobacco: None  . Alcohol Use: No    Review of Systems  Constitutional: Negative for activity change.       All ROS Neg except as noted in HPI  HENT: Negative for nosebleeds and sore throat.   Eyes: Negative for photophobia and discharge.  Respiratory: Negative for cough, shortness of breath and wheezing.   Cardiovascular: Negative for chest pain and palpitations.  Gastrointestinal: Negative for vomiting, abdominal pain and blood in stool.  Genitourinary: Negative for dysuria, frequency and hematuria.   Musculoskeletal: Negative for back pain, arthralgias and neck pain.  Skin: Positive for rash.  Neurological: Negative for dizziness, seizures and speech difficulty.  Psychiatric/Behavioral: Negative for hallucinations and confusion.      Allergies  Penicillins  Home Medications   Prior to Admission medications   Medication Sig Start Date End Date Taking? Authorizing Provider  oxyCODONE-acetaminophen (PERCOCET) 7.5-325 MG per tablet Take 1-2 tablets by mouth every 4 (four) hours as needed. 01/30/13   Franky MachoMark Jenkins, MD  simethicone (GAS RELIEF EXTRA STRENGTH) 125 MG chewable tablet Chew 125 mg by mouth every 6 (six) hours as needed for flatulence (and/or stomach pain).    Historical Provider, MD   BP 121/80 mmHg  Pulse 82  Temp(Src) 98.2 F (36.8 C) (Oral)  Resp 14  Ht 5\' 7"  (1.702 m)  Wt 63.504 kg  BMI 21.92 kg/m2  SpO2 100% Physical Exam  Constitutional: He is oriented to person, place, and time. He appears well-developed and well-nourished.  Non-toxic appearance.  HENT:  Head: Normocephalic.  Right Ear: Tympanic membrane and external ear normal.  Left Ear: Tympanic membrane and external ear normal.  Airway patent.  Eyes: EOM and lids are normal. Pupils are equal, round, and reactive to light.  Neck: Normal range of motion. Neck supple. Carotid bruit is not present.  Cardiovascular: Normal rate, regular rhythm, normal heart sounds, intact distal pulses and normal pulses.   Pulmonary/Chest: Breath sounds normal. No respiratory distress.  Abdominal: Soft. Bowel sounds are normal. There is no tenderness. There is no guarding.  Musculoskeletal: Normal range of motion.  Lymphadenopathy:  Head (right side): No submandibular adenopathy present.       Head (left side): No submandibular adenopathy present.    He has no cervical adenopathy.  Neurological: He is alert and oriented to person, place, and time. He has normal strength. No cranial nerve deficit or sensory deficit.   Skin: Skin is warm and dry. Rash noted.  Blistering rash with red base on the hands and both arms. One area on the helix of the right ear.  Psychiatric: He has a normal mood and affect. His speech is normal.  Nursing note and vitals reviewed.   ED Course  Procedures (including critical care time) Labs Review Labs Reviewed - No data to display  Imaging Review No results found. I have personally reviewed and evaluated these images and lab results as part of my medical decision-making.   EKG Interpretation None      MDM Rash is consistent with contact dermatitis.  Rx for decadron, benadryl, and triamcinolone given to the patient. Pt in agreement with this discharge plan.   Final diagnoses:  Contact dermatitis due to poison oak    **I have reviewed nursing notes, vital signs, and all appropriate lab and imaging results for this patient.Ivery Quale, PA-C 05/16/15 1746  Bethann Berkshire, MD 05/16/15 [phone]

## 2015-05-16 NOTE — Discharge Instructions (Signed)
Use decadron and triamcinolone daily. Use claritin or allegra for itching during the day. Use benadryl at bedtime,or when you are not driving, operating machines, or participating in activities requiring concentration. Contact Dermatitis Dermatitis is redness, soreness, and swelling (inflammation) of the skin. Contact dermatitis is a reaction to certain substances that touch the skin. You either touched something that irritated your skin, or you have allergies to something you touched.  HOME CARE  Skin Care  Moisturize your skin as needed.  Apply cool compresses to the affected areas.   Try taking a bath with:   Epsom salts. Follow the instructions on the package. You can get these at a pharmacy or grocery store.   Baking soda. Pour a small amount into the bath as told by your doctor.   Colloidal oatmeal. Follow the instructions on the package. You can get this at a pharmacy or grocery store.   Try applying baking soda paste to your skin. Stir water into baking soda until it looks like paste.  Do not scratch your skin.   Bathe less often.  Bathe in lukewarm water. Avoid using hot water.  Medicines  Take or apply over-the-counter and prescription medicines only as told by your doctor.   If you were prescribed an antibiotic medicine, take or apply your antibiotic as told by your doctor. Do not stop taking the antibiotic even if your condition starts to get better. General Instructions  Keep all follow-up visits as told by your doctor. This is important.   Avoid the substance that caused your reaction. If you do not know what caused it, keep a journal to try to track what caused it. Write down:   What you eat.   What cosmetic products you use.   What you drink.   What you wear in the affected area. This includes jewelry.   If you were given a bandage (dressing), take care of it as told by your doctor. This includes when to change and remove it.  GET HELP IF:     You do not get better with treatment.   Your condition gets worse.   You have signs of infection such as:  Swelling.  Tenderness.  Redness.  Soreness.  Warmth.   You have a fever.   You have new symptoms.  GET HELP RIGHT AWAY IF:   You have a very bad headache.  You have neck pain.  Your neck is stiff.   You throw up (vomit).   You feel very sleepy.   You see red streaks coming from the affected area.   Your bone or joint underneath the affected area becomes painful after the skin has healed.   The affected area turns darker.   You have trouble breathing.    This information is not intended to replace advice given to you by your health care provider. Make sure you discuss any questions you have with your health care provider.   Document Released: 11/22/2008 Document Revised: 10/16/2014 Document Reviewed: 06/12/2014 Elsevier Interactive Patient Education [employer]2016 [employer]

## 2017-12-27 ENCOUNTER — Encounter: Payer: Self-pay | Admitting: Family

## 2017-12-27 ENCOUNTER — Ambulatory Visit (INDEPENDENT_AMBULATORY_CARE_PROVIDER_SITE_OTHER): Payer: Managed Care, Other (non HMO) | Admitting: Family

## 2017-12-27 ENCOUNTER — Ambulatory Visit (INDEPENDENT_AMBULATORY_CARE_PROVIDER_SITE_OTHER): Payer: Managed Care, Other (non HMO)

## 2017-12-27 VITALS — BP 133/86 | HR 75 | Temp 97.5°F | Ht 65.0 in | Wt 137.0 lb

## 2017-12-27 DIAGNOSIS — F17200 Nicotine dependence, unspecified, uncomplicated: Secondary | ICD-10-CM

## 2017-12-27 DIAGNOSIS — M542 Cervicalgia: Secondary | ICD-10-CM

## 2017-12-27 DIAGNOSIS — S161XXA Strain of muscle, fascia and tendon at neck level, initial encounter: Secondary | ICD-10-CM | POA: Diagnosis not present

## 2017-12-27 MED ORDER — BACLOFEN 10 MG PO TABS: 10.0000 mg | ORAL_TABLET | Freq: Three times a day (TID) | ORAL | 0 refills | Status: DC

## 2017-12-27 MED ORDER — DICLOFENAC SODIUM 75 MG PO TBEC: 75.0000 mg | DELAYED_RELEASE_TABLET | Freq: Two times a day (BID) | ORAL | 0 refills | Status: DC

## 2017-12-27 NOTE — Patient Instructions (Signed)

## 2017-12-27 NOTE — Addendum Note (Signed)
Addended by: Jannifer RodneyHAWKS, Harl Wiechmann A on: 12/27/2017 10:02 AM   Modules accepted: Orders

## 2017-12-27 NOTE — Progress Notes (Addendum)
Subjective:    Patient ID: Casey Yang, male    DOB: Apr 03, 1970, 47 y.o.   MRN: [id number]  Chief Complaint  Patient presents with  . pain supine neck at spine  . New Patient (Initial Visit)   Pt presents to the office today to establish care and complaints of neck pain. States this neck pain for the last month that is gradually worsening.   He states he does not go to the doctor unless he has too and has never had a PCP.  Neck Pain   This is a new problem. The current episode started more than 1 month ago. The problem occurs constantly. The problem has been gradually worsening. The pain is associated with nothing. The pain is present in the midline. The quality of the pain is described as aching. The pain is at a severity of 8/10. The pain is moderate. The symptoms are aggravated by bending. Pertinent negatives include no fever, headaches, leg pain, paresis, photophobia, visual change or weakness. He has tried acetaminophen and NSAIDs for the symptoms. The treatment provided mild relief.      Review of Systems  Constitutional: Negative for fever.  Eyes: Negative for photophobia.  Musculoskeletal: Positive for neck pain.  Neurological: Negative for weakness and headaches.  All other systems reviewed and are negative.   Family History  Problem Relation Age of Onset  . Cancer Mother        stomach  . Heart disease Father        Heart attack   Social History   Socioeconomic History  . Marital status: Divorced    Spouse name: Not on file  . Number of children: Not on file  . Years of education: Not on file  . Highest education level: Not on file  Occupational History  . Not on file  Social Needs  . Financial resource strain: Not on file  . Food insecurity:    Worry: Not on file    Inability: Not on file  . Transportation needs:    Medical: Not on file    Non-medical: Not on file  Tobacco Use  . Smoking status: Current Every Day Smoker    Packs/day: 1.00   Types: Cigarettes  . Smokeless tobacco: Current User    Types: Snuff  Substance and Sexual Activity  . Alcohol use: No  . Drug use: No  . Sexual activity: Not on file  Lifestyle  . Physical activity:    Days per week: Not on file    Minutes per session: Not on file  . Stress: Not on file  Relationships  . Social connections:    Talks on phone: Not on file    Gets together: Not on file    Attends religious service: Not on file    Active member of club or organization: Not on file    Attends meetings of clubs or organizations: Not on file    Relationship status: Not on file  Other Topics Concern  . Not on file  Social History Narrative  . Not on file       Objective:   Physical Exam  Constitutional: He is oriented to person, place, and time. He appears well-developed and well-nourished. No distress.  HENT:  Head: Normocephalic.  Right Ear: External ear normal.  Left Ear: External ear normal.  Mouth/Throat: Oropharynx is clear and moist.  Eyes: Pupils are equal, round, and reactive to light. Right eye exhibits no discharge. Left eye exhibits no discharge.  Neck: Normal range of motion. Neck supple. No thyromegaly present.  Cardiovascular: Normal rate, regular rhythm, normal heart sounds and intact distal pulses.  No murmur heard. Pulmonary/Chest: Effort normal and breath sounds normal. No respiratory distress. He has no wheezes.  Abdominal: Soft. Bowel sounds are normal. He exhibits no distension. There is no tenderness.  Musculoskeletal: Normal range of motion. He exhibits no edema or tenderness.  Full ROM of neck, slight pain with flexion.  Neurological: He is alert and oriented to person, place, and time. He has normal reflexes. No cranial nerve deficit.  Skin: Skin is warm and dry. No rash noted. No erythema.  Psychiatric: He has a normal mood and affect. His behavior is normal. Judgment and thought content normal.  Vitals reviewed.   X-ray- Negative, Preliminary  reading by Jannifer Rodneyhristy Caysen Whang, FNP [facility]   BP 133/86   Pulse 75   Temp (!) 97.5 F (36.4 C) (Oral)   Ht 5\' 5"  (1.651 m)   Wt 137 lb (62.1 kg)   BMI 22.80 kg/m        Kristen LoaderJames H Regula comes in today with chief complaint of pain supine neck at spine and New Patient (Initial Visit)   Diagnosis and orders addressed:  1. Neck pain - DG Cervical Spine Complete; Future -BMP pending  2. Current smoker Smoking cessation discussed  3. Strain of neck muscle, initial encounter Rest Ice ROM exercises discussed RTO if symptoms worsen or do not improve - diclofenac (VOLTAREN) 75 MG EC tablet; Take 1 tablet (75 mg total) by mouth 2 (two) times daily.  Dispense: 30 tablet; Refill: 0 - baclofen (LIORESAL) 10 MG tablet; Take 1 tablet (10 mg total) by mouth 3 (three) times daily.  Dispense: 30 each; Refill: 0 BMP   Jannifer Rodneyhristy Landry Kamath, FNP

## 2018-01-26 ENCOUNTER — Encounter: Payer: Self-pay | Admitting: Family

## 2018-01-26 ENCOUNTER — Ambulatory Visit (INDEPENDENT_AMBULATORY_CARE_PROVIDER_SITE_OTHER): Payer: Managed Care, Other (non HMO) | Admitting: Family

## 2018-01-26 VITALS — BP 156/89 | HR 66 | Temp 98.9°F | Ht 65.0 in | Wt 141.2 lb

## 2018-01-26 DIAGNOSIS — M542 Cervicalgia: Secondary | ICD-10-CM

## 2018-01-26 DIAGNOSIS — F17200 Nicotine dependence, unspecified, uncomplicated: Secondary | ICD-10-CM | POA: Insufficient documentation

## 2018-01-26 DIAGNOSIS — Z0001 Encounter for general adult medical examination with abnormal findings: Secondary | ICD-10-CM | POA: Diagnosis not present

## 2018-01-26 DIAGNOSIS — Z0000 Encounter for general adult medical examination without abnormal findings: Secondary | ICD-10-CM

## 2018-01-26 MED ORDER — BACLOFEN 10 MG PO TABS: 10.0000 mg | ORAL_TABLET | Freq: Three times a day (TID) | ORAL | 3 refills | Status: DC

## 2018-01-26 MED ORDER — DICLOFENAC SODIUM 75 MG PO TBEC: 75.0000 mg | DELAYED_RELEASE_TABLET | Freq: Two times a day (BID) | ORAL | 2 refills | Status: DC

## 2018-01-26 NOTE — Patient Instructions (Signed)

## 2018-01-26 NOTE — Progress Notes (Signed)
Subjective:    Patient ID: Casey Yang, male    DOB: January 22, 1971, 47 y.o.   MRN: [id number]  No chief complaint on file.  Pt presents to the office today for CPE. PT currently not taking any medications at this time. States he continues to have neck pain. States the diclofenac and baclofen did not help.  Neck Pain   This is a recurrent problem. The current episode started more than 1 month ago. The problem occurs intermittently. The problem has been waxing and waning. The pain is associated with nothing. The quality of the pain is described as aching. The pain is at a severity of 7/10. The pain is mild. The pain is same all the time. Pertinent negatives include no fever, headaches, leg pain, numbness, paresis, photophobia, syncope, visual change or weakness. He has tried bed rest, muscle relaxants and NSAIDs for the symptoms. The treatment provided mild relief.      Review of Systems  Constitutional: Negative for fever.  Eyes: Negative for photophobia.  Cardiovascular: Negative for syncope.  Musculoskeletal: Positive for neck pain.  Neurological: Negative for weakness, numbness and headaches.  All other systems reviewed and are negative.   Family History  Problem Relation Age of Onset  . Cancer Mother        stomach  . Heart disease Father        Heart attack    Social History   Socioeconomic History  . Marital status: Divorced    Spouse name: Not on file  . Number of children: Not on file  . Years of education: Not on file  . Highest education level: Not on file  Occupational History  . Not on file  Social Needs  . Financial resource strain: Not on file  . Food insecurity:    Worry: Not on file    Inability: Not on file  . Transportation needs:    Medical: Not on file    Non-medical: Not on file  Tobacco Use  . Smoking status: Current Every Day Smoker    Packs/day: 1.00    Types: Cigarettes  . Smokeless tobacco: Current User    Types: Snuff  Substance and  Sexual Activity  . Alcohol use: No  . Drug use: No  . Sexual activity: Not on file  Lifestyle  . Physical activity:    Days per week: Not on file    Minutes per session: Not on file  . Stress: Not on file  Relationships  . Social connections:    Talks on phone: Not on file    Gets together: Not on file    Attends religious service: Not on file    Active member of club or organization: Not on file    Attends meetings of clubs or organizations: Not on file    Relationship status: Not on file  Other Topics Concern  . Not on file  Social History Narrative  . Not on file       Objective:   Physical Exam Vitals signs reviewed.  Constitutional:      General: He is not in acute distress.    Appearance: He is well-developed.  HENT:     Head: Normocephalic.  Eyes:     General:        Right eye: No discharge.        Left eye: No discharge.     Pupils: Pupils are equal, round, and reactive to light.  Neck:     Musculoskeletal: Normal  range of motion and neck supple.     Thyroid: No thyromegaly.  Cardiovascular:     Rate and Rhythm: Normal rate and regular rhythm.     Heart sounds: Normal heart sounds. No murmur.  Pulmonary:     Effort: Pulmonary effort is normal. No respiratory distress.     Breath sounds: Normal breath sounds. No wheezing.  Abdominal:     General: Bowel sounds are normal. There is no distension.     Palpations: Abdomen is soft.     Tenderness: There is no abdominal tenderness.  Musculoskeletal: Normal range of motion.        General: No tenderness.     Comments: Full ROM, pain in posterior neck with extension   Skin:    General: Skin is warm and dry.     Findings: No erythema or rash.  Neurological:     Mental Status: He is alert and oriented to person, place, and time.     Cranial Nerves: No cranial nerve deficit.     Deep Tendon Reflexes: Reflexes are normal and symmetric.  Psychiatric:        Behavior: Behavior normal.        Thought Content:  Thought content normal.        Judgment: Judgment normal.       There were no vitals taken for this visit.     Assessment & Plan:  Casey Yang comes in today with chief complaint of Annual Exam   Diagnosis and orders addressed:  1. Annual physical exam - CMP14+EGFR - CBC with Differential/Platelet - Lipid panel - TSH - PSA, total and free  2. Neck pain without injury Rest Ice - Ambulatory referral to Physical Therapy - baclofen (LIORESAL) 10 MG tablet; Take 1 tablet (10 mg total) by mouth 3 (three) times daily.  Dispense: 60 each; Refill: 3 - diclofenac (VOLTAREN) 75 MG EC tablet; Take 1 tablet (75 mg total) by mouth 2 (two) times daily.  Dispense: 60 tablet; Refill: 2  3. Current smoker Smoking cessation discussed   Labs pending Health Maintenance reviewed Diet and exercise encouraged  Follow up plan: 1 year    Evelina Dun, FNP

## 2018-01-27 LAB — LIPID PANEL
Chol/HDL Ratio: 1.9 ratio (ref 0.0–5.0)
Cholesterol, Total: 167 mg/dL (ref 100–199)
HDL: 86 mg/dL (ref >39)
LDL CALC: 56 mg/dL (ref 0–99)
TRIGLYCERIDES: 126 mg/dL (ref 0–149)
VLDL Cholesterol Cal: 25 mg/dL (ref 5–40)

## 2018-01-27 LAB — CMP14+EGFR
ALT: 21 IU/L (ref 0–44)
AST: 29 IU/L (ref 0–40)
Albumin/Globulin Ratio: 2.3 — ABNORMAL HIGH (ref 1.2–2.2)
Albumin: 4.2 g/dL (ref 3.5–5.5)
Alkaline Phosphatase: 50 IU/L (ref 39–117)
BUN/Creatinine Ratio: 12 (ref 9–20)
BUN: 13 mg/dL (ref 6–24)
Bilirubin Total: 0.3 mg/dL (ref 0.0–1.2)
CO2: 23 mmol/L (ref 20–29)
CREATININE: 1.06 mg/dL (ref 0.76–1.27)
Calcium: 9.1 mg/dL (ref 8.7–10.2)
Chloride: 103 mmol/L (ref 96–106)
GFR calc Af Amer: 95 mL/min/{1.73_m2} (ref >59)
GFR calc non Af Amer: 82 mL/min/{1.73_m2} (ref >59)
GLUCOSE: 80 mg/dL (ref 65–99)
Globulin, Total: 1.8 g/dL (ref 1.5–4.5)
Potassium: 4.5 mmol/L (ref 3.5–5.2)
Sodium: 143 mmol/L (ref 134–144)
Total Protein: 6 g/dL (ref 6.0–8.5)

## 2018-01-27 LAB — CBC WITH DIFFERENTIAL/PLATELET
Basophils Absolute: 0.1 10*3/uL (ref 0.0–0.2)
Basos: 2 %
EOS (ABSOLUTE): 0.1 10*3/uL (ref 0.0–0.4)
Eos: 2 %
Hematocrit: 41.2 % (ref 37.5–51.0)
Hemoglobin: 13.9 g/dL (ref 13.0–17.7)
IMMATURE GRANULOCYTES: 0 %
Immature Grans (Abs): 0 10*3/uL (ref 0.0–0.1)
LYMPHS: 33 %
Lymphocytes Absolute: 1.9 10*3/uL (ref 0.7–3.1)
MCH: 29.3 pg (ref 26.6–33.0)
MCHC: 33.7 g/dL (ref 31.5–35.7)
MCV: 87 fL (ref 79–97)
MONOCYTES: 11 %
Monocytes Absolute: 0.6 10*3/uL (ref 0.1–0.9)
Neutrophils Absolute: 3.1 10*3/uL (ref 1.4–7.0)
Neutrophils: 52 %
Platelets: 175 10*3/uL (ref 150–450)
RBC: 4.74 x10E6/uL (ref 4.14–5.80)
RDW: 15 % (ref 12.3–15.4)
WBC: 5.8 10*3/uL (ref 3.4–10.8)

## 2018-01-27 LAB — PSA, TOTAL AND FREE
PSA FREE PCT: 20.3 %
PSA, Free: 0.69 ng/mL
Prostate Specific Ag, Serum: 3.4 ng/mL (ref 0.0–4.0)

## 2018-01-27 LAB — TSH: TSH: 1.57 u[IU]/mL (ref 0.450–4.500)

## 2018-02-07 ENCOUNTER — Encounter: Payer: Self-pay | Admitting: Physical Therapy

## 2018-02-07 ENCOUNTER — Other Ambulatory Visit: Payer: Self-pay

## 2018-02-07 ENCOUNTER — Ambulatory Visit: Payer: Managed Care, Other (non HMO) | Attending: Family | Admitting: Physical Therapy

## 2018-02-07 DIAGNOSIS — M542 Cervicalgia: Secondary | ICD-10-CM | POA: Insufficient documentation

## 2018-02-07 DIAGNOSIS — R293 Abnormal posture: Secondary | ICD-10-CM | POA: Diagnosis present

## 2018-02-07 NOTE — Therapy (Signed)
[facility] [address] [place], [place], [address] Phone: [phone]   Fax:  [phone]  Physical Therapy Evaluation  Patient Details  Name: Casey Yang MRN: [id number] Date of Birth: 08/07/1970 Referring Provider (PT): Jannifer Rodney, FNP   Encounter Date: 02/07/2018  PT End of Session - 02/07/18 0908    Visit Number  1    Number of Visits  12    Date for PT Re-Evaluation  03/28/18    Authorization Type  Progress note every 10th visit    PT Start Time  0815    PT Stop Time  0852    PT Time Calculation (min)  37 min    Activity Tolerance  Patient tolerated treatment well    Behavior During Therapy  [facility] for tasks assessed/performed       History reviewed. No pertinent past medical history.  Past Surgical History:  Procedure Laterality Date  . APPENDECTOMY    . LAPAROSCOPIC APPENDECTOMY N/A 01/29/2013   Procedure: APPENDECTOMY LAPAROSCOPIC;  Surgeon: Dalia Heading, MD;  Location: [facility];  Service: General;  Laterality: N/A;    There were no vitals filed for this visit.   Subjective Assessment - 02/07/18 0944    Subjective  Patient arrives to physical therapy with reports of midline neck pain and stiffness that began about a month ago due to an unknown cause. Patient reports having difficulties with bending his head down and lifting at work but reports he works through the pain. Patient reports pain as pressure and stiffness and denies any neurological symptoms down either extremity. Patient reports pain at worst is 7/10; patient's pain at best is 5/10. Patient's goals are to decrease and have less difficulties with work and home activities.    Limitations  Lifting;Other (comment);House hold activities   work activities   Diagnostic tests  x-ray: mild degeneration (see imaging)    Patient Stated Goals  get rid of pain    Currently in Pain?  Yes    Pain Score  5     Pain Location  Neck    Pain Orientation   Posterior;Lower    Pain Descriptors / Indicators  Constant;Tightness;Pressure   stiff   Pain Type  Acute pain    Pain Onset  More than a month ago    Pain Frequency  Constant    Aggravating Factors   looking down    Pain Relieving Factors  "nothing"          [facility] PT Assessment - 02/07/18 0001      Assessment   Medical Diagnosis  neck pain without injury    Referring Provider (PT)  Jannifer Rodney, FNP    Onset Date/Surgical Date  --   ongoing for one month   Hand Dominance  --   "even handed"   Next MD Visit  February 16, 2018    Prior Therapy  no      Precautions   Precautions  None      Restrictions   Weight Bearing Restrictions  No      Balance Screen   Has the patient fallen in the past 6 months  No    Has the patient had a decrease in activity level because of a fear of falling?   No    Is the patient reluctant to leave their home because of a fear of falling?   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      Posture/Postural Control   Posture/Postural Control  Postural limitations    Postural Limitations  Rounded Shoulders;Forward head;Decreased thoracic kyphosis      ROM / Strength   AROM / PROM / Strength  AROM;Strength      AROM   AROM Assessment Site  Cervical    Cervical Flexion  60    Cervical Extension  38    Cervical - Right Side Bend  22    Cervical - Left Side Bend  25    Cervical - Right Rotation  70    Cervical - Left Rotation  65      Strength   Strength Assessment Site  Shoulder    Right/Left Shoulder  Right;Left    Right Shoulder Flexion  4-/5    Right Shoulder ABduction  4-/5    Right Shoulder Internal Rotation  4+/5    Right Shoulder External Rotation  4+/5    Left Shoulder Flexion  4-/5    Left Shoulder ABduction  4-/5    Left Shoulder Internal Rotation  4+/5    Left Shoulder  External Rotation  4+/5      Palpation   Palpation comment  minimal tenderness to palpation to cervical paraspinals or UT                Objective measurements completed on examination: See above findings.              PT Education - 02/07/18 0950    Education Details  chin tucks, scapular retractions, levator scap stretch    Person(s) Educated  Patient    Methods  Explanation;Demonstration;Handout    Comprehension  Verbalized understanding;Returned demonstration       PT Short Term Goals - 02/07/18 0954      PT SHORT TERM GOAL #1   Title  STG=LTG        PT Long Term Goals - 02/07/18 0909      PT LONG TERM GOAL #1   Title  Patient will be independent with HEP and its progression    Time  6    Period  Weeks    Status  New      PT LONG TERM GOAL #2   Title  Patient will demonstrate 4+/5 or greater bilateral shoulder MMT to improve stabilization during functional tasks.    Time  6    Period  Weeks    Status  New      PT LONG TERM GOAL #3   Title  Patient will report ability to perform ADLS and work activities with cervical pain less than 3/10.    Time  6    Period  Weeks    Status  New             Plan - 02/07/18 0951    Clinical Impression Statement  Patient is a 47 year old male who presents to physical therapy with midline neck pain and decreased shoulder MMT bilaterally. Patient denies any tenderness to palpation along cervical paraspinals. Patient noted with increased pain with cervical flexion and left side bending. Patient noted with rounded shoulders and forward head. Patient would benefit from skilled physical therapy to address deficits and address goals.     Clinical Presentation  Evolving    Clinical Presentation due to:  not improving  Clinical Decision Making  Low    Rehab Potential  Good    PT Frequency  2x / week    PT Duration  6 weeks    PT Treatment/Interventions  ADLs/Self Care Home  Management;Cryotherapy;Ultrasound;Traction;Moist Heat;Electrical Stimulation;Neuromuscular re-education;Therapeutic exercise;Therapeutic activities;Patient/family education;Manual techniques;Dry needling;Passive range of motion;Taping;Spinal Manipulations    PT Next Visit Plan  UBE, postural exercises, thoracic mobility, shoulder strengthening,  modalities for pain relief.    PT Home Exercise Plan  see patient education     Consulted and Agree with Plan of Care  Patient       Patient will benefit from skilled therapeutic intervention in order to improve the following deficits and impairments:  Pain, Postural dysfunction, Decreased strength, Decreased range of motion  Visit Diagnosis: Cervicalgia - Plan: PT plan of care cert/re-cert  Abnormal posture - Plan: PT plan of care cert/re-cert     Problem List Patient Active Problem List   Diagnosis Date Noted  . Current smoker 01/26/2018  . Acute appendicitis 01/29/2013   Guss BundeKrystle Melayah Skorupski, PT, DPT 02/07/2018, 10:06 AM  [facility] [address] [place], [place], [address] Phone: [phone]   Fax:  [phone]  Name: Casey Yang MRN: [id number] Date of Birth: [birth year]

## 2018-02-09 ENCOUNTER — Encounter: Payer: Self-pay | Admitting: Physical Therapy

## 2018-02-09 ENCOUNTER — Ambulatory Visit: Payer: Managed Care, Other (non HMO) | Attending: Family | Admitting: Physical Therapy

## 2018-02-09 DIAGNOSIS — M542 Cervicalgia: Secondary | ICD-10-CM

## 2018-02-09 DIAGNOSIS — R293 Abnormal posture: Secondary | ICD-10-CM

## 2018-02-09 NOTE — Therapy (Signed)
[facility] [address] [place], [place], [address] Phone: [phone]   Fax:  [phone]  Physical Therapy Treatment  Patient Details  Name: Casey Yang MRN: [id number] Date of Birth: 1970-12-02 Referring Provider (PT): Jannifer Rodney, FNP   Encounter Date: 02/09/2018  PT End of Session - 02/09/18 1718    Visit Number  2    Number of Visits  12    Date for PT Re-Evaluation  03/28/18    Authorization Type  Progress note every 10th visit    PT Start Time  0400    PT Stop Time  0453    PT Time Calculation (min)  53 min    Activity Tolerance  Patient tolerated treatment well    Behavior During Therapy  [facility] for tasks assessed/performed       History reviewed. No pertinent past medical history.  Past Surgical History:  Procedure Laterality Date  . APPENDECTOMY    . LAPAROSCOPIC APPENDECTOMY N/A 01/29/2013   Procedure: APPENDECTOMY LAPAROSCOPIC;  Surgeon: Dalia Heading, MD;  Location: [facility];  Service: General;  Laterality: N/A;    There were no vitals filed for this visit.  Subjective Assessment - 02/09/18 1719    Subjective  Pain at about a 6 today.  I've been doing those neck exercises.    Limitations  Lifting;Other (comment);House hold activities    Diagnostic tests  x-ray: mild degeneration (see imaging)    Patient Stated Goals  get rid of pain    Currently in Pain?  Yes    Pain Score  6     Pain Location  Neck    Pain Orientation  Posterior;Lower    Pain Descriptors / Indicators  Pressure;Tightness;Constant    Pain Type  Acute pain    Pain Onset  More than a month ago                       [facility] Adult PT Treatment/Exercise - 02/09/18 0001      Modalities   Modalities  Electrical Stimulation;Moist Heat;Ultrasound      Moist Heat Therapy   Number Minutes Moist Heat  20 Minutes    Moist Heat Location  Cervical      Electrical Stimulation   Electrical Stimulation Location  Cervical.    Electrical Stimulation Action  IFC    Electrical Stimulation Parameters  80-150 Hz on 100% scan x 20 minutes.    Electrical Stimulation Goals  Pain      Ultrasound   Ultrasound Location  Bilateral cervical.    Ultrasound Parameters  Combo e'stim/U/S at 1.50 W/CM2 x 12 minutes.    Ultrasound Goals  Pain      Manual Therapy   Manual Therapy  Soft tissue mobilization    Soft tissue mobilization  STW/M x 12 minutes to bilateral cervical musculature and especially right of C7.               PT Short Term Goals - 02/07/18 0954      PT SHORT TERM GOAL #1   Title  STG=LTG        PT Long Term Goals - 02/07/18 0909      PT LONG TERM GOAL #1   Title  Patient will be independent with HEP and its progression    Time  6    Period  Weeks    Status  New      PT LONG TERM GOAL #2   Title  Patient will demonstrate 4+/5 or greater bilateral shoulder MMT to improve stabilization during functional tasks.    Time  6    Period  Weeks    Status  New      PT LONG TERM GOAL #3   Title  Patient will report ability to perform ADLS and work activities with cervical pain less than 3/10.    Time  6    Period  Weeks    Status  New            Plan - 02/09/18 1724    Clinical Impression Statement  The patient presents to the clinic today with a pre-tx pain-level of 6/10.  He did report increase palpable pain right of C7 today.  He responded very well to treatment today.      Rehab Potential  Good    PT Frequency  2x / week    PT Duration  6 weeks    PT Next Visit Plan  Begin int traction at 15#.    PT Home Exercise Plan  see patient education     Consulted and Agree with Plan of Care  Patient       Patient will benefit from skilled therapeutic intervention in order to improve the following deficits and impairments:  Pain, Postural dysfunction, Decreased strength, Decreased range of motion  Visit Diagnosis: Cervicalgia  Abnormal posture     Problem List Patient Active  Problem List   Diagnosis Date Noted  . Current smoker 01/26/2018  . Acute appendicitis 01/29/2013    Tequilla Cousineau, [place] MPT 02/09/2018, 5:26 PM  [facility] [address] [place], [place], [address] Phone: [phone]   Fax:  [phone]  Name: AZAIAH KELLISON MRN: [id number] Date of Birth: 11-06-1970

## 2018-02-14 ENCOUNTER — Encounter: Payer: Self-pay | Admitting: Physical Therapy

## 2018-02-14 ENCOUNTER — Ambulatory Visit: Payer: Managed Care, Other (non HMO) | Admitting: Physical Therapy

## 2018-02-14 DIAGNOSIS — M542 Cervicalgia: Secondary | ICD-10-CM | POA: Diagnosis not present

## 2018-02-14 DIAGNOSIS — R293 Abnormal posture: Secondary | ICD-10-CM

## 2018-02-14 NOTE — Therapy (Signed)
[facility] [address] [place], [place], [address] Phone: [phone]   Fax:  [phone]  Physical Therapy Treatment  Patient Details  Name: Casey LoaderJames H Yang MRN: [id number] Date of Birth: 08-Aug-1970 Referring Provider (PT): Jannifer Rodneyhristy Hawks, FNP   Encounter Date: 02/14/2018  PT End of Session - 02/14/18 1630    Visit Number  3    Number of Visits  12    Date for PT Re-Evaluation  03/28/18    Authorization Type  Progress note every 10th visit    PT Start Time  1600    PT Stop Time  1702    PT Time Calculation (min)  62 min    Activity Tolerance  Patient tolerated treatment well    Behavior During Therapy  [facility] for [facility] assessed/performed       History reviewed. No pertinent past medical history.  Past Surgical History:  Procedure Laterality Date  . APPENDECTOMY    . LAPAROSCOPIC APPENDECTOMY N/A 01/29/2013   Procedure: APPENDECTOMY LAPAROSCOPIC;  Surgeon: Dalia HeadingMark A Jenkins, MD;  Location: [facility];  Service: General;  Laterality: N/A;    There were no vitals filed for this visit.  Subjective Assessment - 02/14/18 1629    Subjective  Reports about 5/10 neck pain today. Reported last visit went well.    Limitations  Lifting;Other (comment);House hold activities    Diagnostic tests  x-ray: mild degeneration (see imaging)    Patient Stated Goals  get rid of pain    Currently in Pain?  Yes    Pain Score  5     Pain Location  Neck    Pain Orientation  Posterior;Lower    Pain Descriptors / Indicators  Pressure;Tender    Pain Type  Acute pain    Pain Onset  More than a month ago    Pain Frequency  Constant         [facility] PT Assessment - 02/14/18 0001      Assessment   Medical Diagnosis  neck pain without injury    Referring Provider (PT)  Jannifer Rodneyhristy Hawks, FNP    Next MD Visit  February 16, 2018    Prior Therapy  no                   [facility] - 02/14/18 0001      Modalities   Modalities   Electrical Stimulation;Moist Heat;Ultrasound;Traction      Moist Heat Therapy   Number Minutes Moist Heat  15 Minutes    Moist Heat Location  Cervical      Electrical Stimulation   Electrical Stimulation Location  Cervical.    Electrical Stimulation Action  IFC    Electrical Stimulation Parameters  80-150 hz x15 mins    Electrical Stimulation Goals  Pain      Ultrasound   Ultrasound Location  right cervical paraspinals     Ultrasound Parameters  combo e-stim/US 100% 1 mhz 1.5w/cm2 x12    Ultrasound Goals  Pain      Traction   Type of Traction  Cervical    Min (lbs)  5    Max (lbs)  15    Hold Time  99    Rest Time  5    Time  15               PT Short Term Goals - 02/07/18 0954      PT SHORT TERM GOAL #1   Title  STG=LTG  PT Long Term Goals - 02/07/18 0909      PT LONG TERM GOAL #1   Title  Patient will be independent with HEP and its progression    Time  6    Period  Weeks    Status  New      PT LONG TERM GOAL #2   Title  Patient will demonstrate 4+/5 or greater bilateral shoulder MMT to improve stabilization during functional tasks.    Time  6    Period  Weeks    Status  New      PT LONG TERM GOAL #3   Title  Patient will report ability to perform ADLS and work activities with cervical pain less than 3/10.    Time  6    Period  Weeks    Status  New            Plan - 02/14/18 1632    Clinical Impression Statement  Patient was able to tolerate treatment well today. Patient denied any increase of pain with combo US/e-stim. Traction initiated today at 15# pull weight. No adverse affects noted upon completion of traction and reported feeling like he can increase pull weight. No adverse affects noted upon removal of e-stim.     Clinical Presentation  Evolving    Clinical Decision Making  Low    Rehab Potential  Good    PT Frequency  2x / week    PT Duration  6 weeks    PT Treatment/Interventions  ADLs/Self Care Home  Management;Cryotherapy;Ultrasound;Traction;Moist Heat;Electrical Stimulation;Neuromuscular re-education;Therapeutic exercise;Therapeutic activities;Patient/family education;Manual techniques;Dry needling;Passive range of motion;Taping;Spinal Manipulations    PT Next Visit Plan  Assess response to traction at 15# increase if no adverse affects ; modalities for pain relief.    PT Home Exercise Plan  see patient education     Consulted and Agree with Plan of Care  Patient       Patient will benefit from skilled therapeutic intervention in order to improve the following deficits and impairments:  Pain, Postural dysfunction, Decreased strength, Decreased range of motion  Visit Diagnosis: Cervicalgia  Abnormal posture     Problem List Patient Active Problem List   Diagnosis Date Noted  . Current smoker 01/26/2018  . Acute appendicitis 01/29/2013   Guss Bunde, PT, DPT 02/14/2018, 5:07 PM  [facility] [address] [place], [place], [address] Phone: [phone]   Fax:  [phone]  Name: Casey Yang MRN: [id number] Date of Birth: 04/10/1970

## 2018-02-16 ENCOUNTER — Ambulatory Visit: Payer: Managed Care, Other (non HMO) | Admitting: *Deleted

## 2018-02-16 ENCOUNTER — Ambulatory Visit (INDEPENDENT_AMBULATORY_CARE_PROVIDER_SITE_OTHER): Payer: Managed Care, Other (non HMO)

## 2018-02-16 VITALS — BP 147/73 | HR 73

## 2018-02-16 DIAGNOSIS — M542 Cervicalgia: Secondary | ICD-10-CM | POA: Diagnosis not present

## 2018-02-16 DIAGNOSIS — R030 Elevated blood-pressure reading, without diagnosis of hypertension: Secondary | ICD-10-CM

## 2018-02-16 DIAGNOSIS — R293 Abnormal posture: Secondary | ICD-10-CM

## 2018-02-16 MED ORDER — HYDROCHLOROTHIAZIDE 12.5 MG PO TABS: 12.5000 mg | ORAL_TABLET | Freq: Every day | ORAL | 3 refills | Status: DC

## 2018-02-16 NOTE — Progress Notes (Signed)
Patient presents to office today for a BP check from previous visit in December. Readings were still elevated today. Advised patient that I would send to PCP for further instruction and we would call him. Patient verbalized understanding.     *Please call patient and let me know I have sent in HCTZ 12.5 mg. He will need to take this in the AM as this is a slight fluid pill. He needs follow up in 2 weeks.

## 2018-02-16 NOTE — Therapy (Signed)
[facility] [address] [place], [place], [address] Phone: [phone]   Fax:  [phone]  Physical Therapy Treatment  Patient Details  Name: Casey Yang MRN: [id number] Date of Birth: 1970-10-27 Referring Provider (PT): Jannifer Rodney, FNP   Encounter Date: 02/16/2018  PT End of Session - 02/16/18 1622    Visit Number  4    Number of Visits  12    Date for PT Re-Evaluation  03/28/18    Authorization Type  Progress note every 10th visit    PT Start Time  1623    PT Stop Time  1720    PT Time Calculation (min)  57 min       No past medical history on file.  Past Surgical History:  Procedure Laterality Date  . APPENDECTOMY    . LAPAROSCOPIC APPENDECTOMY N/A 01/29/2013   Procedure: APPENDECTOMY LAPAROSCOPIC;  Surgeon: Dalia Heading, MD;  Location: [facility];  Service: General;  Laterality: N/A;    There were no vitals filed for this visit.  Subjective Assessment - 02/16/18 1623    Subjective  Reports about 3-4/10 neck pain today. Reported last visit went well.  RT rib pain/ cramps    Limitations  Lifting;Other (comment);House hold activities    Diagnostic tests  x-ray: mild degeneration (see imaging)    Patient Stated Goals  get rid of pain    Currently in Pain?  Yes    Pain Score  4     Pain Location  Neck    Pain Orientation  Posterior;Lower    Pain Descriptors / Indicators  Sore;Aching    Pain Type  Acute pain    Pain Onset  More than a month ago                       [facility] Adult PT Treatment/Exercise - 02/16/18 0001      Modalities   Modalities  Electrical Stimulation;Moist Heat;Ultrasound;Traction      Moist Heat Therapy   Number Minutes Moist Heat  15 Minutes    Moist Heat Location  Cervical      Ultrasound   Ultrasound Location  RT/LT [place] near C6-7    Ultrasound Parameters  Combo 1.5 w/cm2x12 mins     Ultrasound Goals  Pain      Traction   Type of Traction  Cervical    Min (lbs)  5     Max (lbs)  18    Hold Time  99    Rest Time  5    Time  15      Manual Therapy   Manual Therapy  Soft tissue mobilization    Soft tissue mobilization  STW/M x 12 minutes to bilateral cervical musculature and especially right of C7.               PT Short Term Goals - 02/07/18 0954      PT SHORT TERM GOAL #1   Title  STG=LTG        PT Long Term Goals - 02/16/18 1701      PT LONG TERM GOAL #1   Title  Patient will be independent with HEP and its progression    Time  6    Period  Weeks    Status  On-going      PT LONG TERM GOAL #2   Title  Patient will demonstrate 4+/5 or greater bilateral shoulder MMT to improve stabilization during functional tasks.  Time  6    Period  Weeks    Status  On-going      PT LONG TERM GOAL #3   Title  Patient will report ability to perform ADLS and work activities with cervical pain less than 3/10.    Time  6    Period  Weeks    Status  New            Plan - 02/16/18 1702    Clinical Impression Statement  Pt arrived today feeling a little better since last Rx. He reports not having the same pain and did well with traction at 15#s and was progressed to 18#s today and also tolerated well.. Normal modality response. Unable to meet LTGs yet due to pain and weakness deficits still.    Clinical Presentation  Evolving    Rehab Potential  Good    PT Frequency  2x / week    PT Duration  6 weeks    PT Next Visit Plan  Assess response to traction at 18# increase if no adverse affects ; modalities for pain relief.    PT Home Exercise Plan  see patient education     Consulted and Agree with Plan of Care  Patient       Patient will benefit from skilled therapeutic intervention in order to improve the following deficits and impairments:  Pain, Postural dysfunction, Decreased strength, Decreased range of motion  Visit Diagnosis: Cervicalgia  Abnormal posture     Problem List Patient Active Problem List   Diagnosis Date  Noted  . Current smoker 01/26/2018  . Acute appendicitis 01/29/2013    RAMSEUR,CHRIS, PTA 02/16/2018, 5:22 PM  [facility] [address] [place], [place], [address] Phone: [phone]   Fax:  [phone]  Name: GERVASE PETRICCA MRN: [id number] Date of Birth: 24-Jun-1970

## 2018-02-17 ENCOUNTER — Telehealth: Payer: Self-pay | Admitting: *Deleted

## 2018-02-17 NOTE — Telephone Encounter (Signed)
Left details of new script , HCTZ.  Please call back to confirm hearing message.

## 2018-02-17 NOTE — Progress Notes (Signed)
1-10 Lm/js

## 2018-02-21 ENCOUNTER — Ambulatory Visit: Payer: Managed Care, Other (non HMO) | Admitting: Physical Therapy

## 2018-02-21 ENCOUNTER — Encounter: Payer: Self-pay | Admitting: Physical Therapy

## 2018-02-21 DIAGNOSIS — M542 Cervicalgia: Secondary | ICD-10-CM

## 2018-02-21 DIAGNOSIS — R293 Abnormal posture: Secondary | ICD-10-CM

## 2018-02-21 NOTE — Therapy (Signed)
[facility] [address] [place], [place], [address] Phone: [phone]   Fax:  [phone]  Physical Therapy Treatment  Patient Details  Name: Casey LoaderJames H Yang MRN: [id number] Date of Birth: December 22, 1970 Referring Provider (PT): Jannifer Rodneyhristy Hawks, FNP   Encounter Date: 02/21/2018  PT End of Session - 02/21/18 1656    Visit Number  5    Number of Visits  12    Date for PT Re-Evaluation  03/28/18    Authorization Type  Progress note every 10th visit    PT Start Time  1600    PT Stop Time  1654    PT Time Calculation (min)  54 min    Activity Tolerance  Patient tolerated treatment well    Behavior During Therapy  [facility] for tasks assessed/performed       History reviewed. No pertinent past medical history.  Past Surgical History:  Procedure Laterality Date  . APPENDECTOMY    . LAPAROSCOPIC APPENDECTOMY N/A 01/29/2013   Procedure: APPENDECTOMY LAPAROSCOPIC;  Surgeon: Dalia HeadingMark A Jenkins, MD;  Location: [facility];  Service: General;  Laterality: N/A;    There were no vitals filed for this visit.  Subjective Assessment - 02/21/18 1649    Subjective  Patient reports 2-3 pain today and has been getting better. 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    Limitations  Lifting;Other (comment);House hold activities    Diagnostic tests  x-ray: mild degeneration (see imaging)    Patient Stated Goals  get rid of pain    Currently in Pain?  Yes    Pain Score  3          [facility] PT Assessment - 02/21/18 0001      Assessment   Medical Diagnosis  neck pain without injury    Referring Provider (PT)  Jannifer Rodneyhristy Hawks, FNP    Next MD Visit  February 16, 2018    Prior Therapy  no                   [facility] Treatment/Exercise - 02/21/18 0001      Modalities   Modalities  Electrical Stimulation;Moist Heat;Ultrasound;Traction      Moist Heat Therapy   Number Minutes Moist Heat  15 Minutes    Moist  Heat Location  --   upper thoracic     Ultrasound   Ultrasound Location  Right UT    Ultrasound Parameters  combo 1.5 w/cm2, 100% 1mhz    Ultrasound Goals  Pain      Traction   Type of Traction  Cervical    Min (lbs)  5    Max (lbs)  20    Hold Time  99    Rest Time  5    Time  15      Manual Therapy   Manual Therapy  Soft tissue mobilization    Soft tissue mobilization  STW/M x 12 minutes to bilateral cervical musculature and especially right of C7.               PT Short Term Goals - 02/07/18 0954      PT SHORT TERM GOAL #1   Title  STG=LTG        PT Long Term Goals - 02/16/18 1701      PT LONG TERM GOAL #1   Title  Patient will be independent with HEP and its progression    Time  6  Period  Weeks    Status  On-going      PT LONG TERM GOAL #2   Title  Patient will demonstrate 4+/5 or greater bilateral shoulder MMT to improve stabilization during functional tasks.    Time  6    Period  Weeks    Status  On-going      PT LONG TERM GOAL #3   Title  Patient will report ability to perform ADLS and work activities with cervical pain less than 3/10.    Time  6    Period  Weeks    Status  New            Plan - 02/21/18 1656    Clinical Impression Statement  Patient was able to tolerate treatment well with progression of traction to 20#s. Patient noted with decreased UT tone at end of STW/M. Normal response to modalities upon removal.     Clinical Presentation  Evolving    Clinical Decision Making  Low    Rehab Potential  Good    PT Frequency  2x / week    PT Duration  6 weeks    PT Treatment/Interventions  ADLs/Self Care Home Management;Cryotherapy;Ultrasound;Traction;Moist Heat;Electrical Stimulation;Neuromuscular re-education;Therapeutic exercise;Therapeutic activities;Patient/family education;Manual techniques;Dry needling;Passive range of motion;Taping;Spinal Manipulations    PT Next Visit Plan  Assess response to traction at 20# increase if no  adverse affects ; modalities for pain relief.    PT Home Exercise Plan  see patient education     Consulted and Agree with Plan of Care  Patient       Patient will benefit from skilled therapeutic intervention in order to improve the following deficits and impairments:  Pain, Postural dysfunction, Decreased strength, Decreased range of motion  Visit Diagnosis: Cervicalgia  Abnormal posture     Problem List Patient Active Problem List   Diagnosis Date Noted  . Current smoker 01/26/2018  . Acute appendicitis 01/29/2013   Guss Bunde, PT, DPT 02/21/2018, 5:02 PM  [facility] [address] [place], [place], [address] Phone: [phone]   Fax:  [phone]  Name: Casey Yang MRN: [id number] Date of Birth: [birth year]

## 2018-02-23 ENCOUNTER — Encounter: Payer: Self-pay | Admitting: Physical Therapy

## 2018-02-23 ENCOUNTER — Ambulatory Visit: Payer: Managed Care, Other (non HMO) | Admitting: Physical Therapy

## 2018-02-23 DIAGNOSIS — M542 Cervicalgia: Secondary | ICD-10-CM

## 2018-02-23 DIAGNOSIS — R293 Abnormal posture: Secondary | ICD-10-CM

## 2018-02-23 NOTE — Therapy (Signed)
[facility] [address] [place], [place], [address] Phone: [phone]   Fax:  [phone]  Physical Therapy Treatment  Patient Details  Name: Casey Yang MRN: [id number] Date of Birth: January 29, 1971 Referring Provider (PT): Jannifer Rodney, FNP   Encounter Date: 02/23/2018  PT End of Session - 02/23/18 2006    Visit Number  6    Number of Visits  12    Date for PT Re-Evaluation  03/28/18    Authorization Type  Progress note every 10th visit    PT Start Time  1645    PT Stop Time  1738    PT Time Calculation (min)  53 min    Activity Tolerance  Patient tolerated treatment well    Behavior During Therapy  [facility] for tasks assessed/performed       History reviewed. No pertinent past medical history.  Past Surgical History:  Procedure Laterality Date  . APPENDECTOMY    . LAPAROSCOPIC APPENDECTOMY N/A 01/29/2013   Procedure: APPENDECTOMY LAPAROSCOPIC;  Surgeon: Dalia Heading, MD;  Location: [facility];  Service: General;  Laterality: N/A;    There were no vitals filed for this visit.                    [facility] Adult PT Treatment/Exercise - 02/23/18 0001      Exercises   Exercises  Neck      Neck Exercises: Machines for Strengthening   UBE (Upper Arm Bike)  120 x8 minutes      Ultrasound   Ultrasound Location  R UT and rhomboitds    Ultrasound Parameters  combo 1.5 w/cm2 1 mhz, 100% x12 mins    Ultrasound Goals  Pain      Traction   Type of Traction  Cervical    Min (lbs)  5    Max (lbs)  23    Hold Time  99    Rest Time  5    Time  12               PT Short Term Goals - 02/07/18 0954      PT SHORT TERM GOAL #1   Title  STG=LTG        PT Long Term Goals - 02/16/18 1701      PT LONG TERM GOAL #1   Title  Patient will be independent with HEP and its progression    Time  6    Period  Weeks    Status  On-going      PT LONG TERM GOAL #2   Title  Patient will demonstrate 4+/5 or greater  bilateral shoulder MMT to improve stabilization during functional tasks.    Time  6    Period  Weeks    Status  On-going      PT LONG TERM GOAL #3   Title  Patient will report ability to perform ADLS and work activities with cervical pain less than 3/10.    Time  6    Period  Weeks    Status  New            Plan - 02/23/18 2006    Clinical Impression Statement  Patient was able to tolerate well with addition of UBE. Patient tolerated combo e-stim/US with no adverse affects. Patient max traction at 23# with no reports of pain or discomfort.    Clinical Presentation  Evolving    Clinical Decision Making  Low    Rehab  Potential  Good    PT Frequency  2x / week    PT Duration  6 weeks    PT Treatment/Interventions  ADLs/Self Care Home Management;Cryotherapy;Ultrasound;Traction;Moist Heat;Electrical Stimulation;Neuromuscular re-education;Therapeutic exercise;Therapeutic activities;Patient/family education;Manual techniques;Dry needling;Passive range of motion;Taping;Spinal Manipulations    PT Next Visit Plan  Max traction at 23#; add more postural exercises and neck strengthening exercises; modalities for pain relief.    Consulted and Agree with Plan of Care  Patient       Patient will benefit from skilled therapeutic intervention in order to improve the following deficits and impairments:  Pain, Postural dysfunction, Decreased strength, Decreased range of motion  Visit Diagnosis: Cervicalgia  Abnormal posture     Problem List Patient Active Problem List   Diagnosis Date Noted  . Current smoker 01/26/2018  . Acute appendicitis 01/29/2013    Guss Bunde, PT, DPT  02/23/2018, 8:11 PM  [facility] [address] [place], [place], [address] Phone: [phone]   Fax:  [phone]  Name: NAIF HOBGOOD MRN: [id number] Date of Birth: November 28, 1970

## 2018-02-28 ENCOUNTER — Ambulatory Visit: Payer: Managed Care, Other (non HMO) | Admitting: Physical Therapy

## 2018-02-28 ENCOUNTER — Encounter: Payer: Self-pay | Admitting: Physical Therapy

## 2018-02-28 DIAGNOSIS — M542 Cervicalgia: Secondary | ICD-10-CM

## 2018-02-28 DIAGNOSIS — R293 Abnormal posture: Secondary | ICD-10-CM

## 2018-02-28 NOTE — Therapy (Signed)
[facility] [address] [place], [place], [address] Phone: [phone]   Fax:  [phone]  Physical Therapy Treatment  Patient Details  Name: Casey Yang MRN: [id number] Date of Birth: [birth year] Referring Provider (PT): Jannifer Rodney, FNP   Encounter Date: 02/28/2018  PT End of Session - 02/28/18 1606    Visit Number  7    Number of Visits  12    Date for PT Re-Evaluation  03/28/18    Authorization Type  Progress note every 10th visit    PT Start Time  1602    PT Stop Time  1651    PT Time Calculation (min)  49 min    Activity Tolerance  Patient tolerated treatment well    Behavior During Therapy  [facility] for tasks assessed/performed       History reviewed. No pertinent past medical history.  Past Surgical History:  Procedure Laterality Date  . APPENDECTOMY    . LAPAROSCOPIC APPENDECTOMY N/A 01/29/2013   Procedure: APPENDECTOMY LAPAROSCOPIC;  Surgeon: Dalia Heading, MD;  Location: [facility];  Service: General;  Laterality: N/A;    There were no vitals filed for this visit.  Subjective Assessment - 02/28/18 1603    Subjective  Reports that he doesn't have much pain today just presure picking up glass.    Limitations  Lifting;Other (comment);House hold activities    Diagnostic tests  x-ray: mild degeneration (see imaging)    Patient Stated Goals  get rid of pain    Currently in Pain?  No/denies         [facility] PT Assessment - 02/28/18 0001      Assessment   Medical Diagnosis  neck pain without injury    Referring Provider (PT)  Jannifer Rodney, FNP    Next MD Visit  February 16, 2018    Prior Therapy  no      Precautions   Precautions  None      Restrictions   Weight Bearing Restrictions  No                   [facility] Adult PT Treatment/Exercise - 02/28/18 0001      Neck Exercises: Machines for Strengthening   UBE (Upper Arm Bike)  90 RPM x8 min      Neck Exercises: Standing   Neck Retraction  20 reps;5  secs    Wall Push Ups  20 reps    Upper Extremity D2  Flexion;20 reps;Theraband    Theraband Level (UE D2)  Level 2 (Red)    UE D2 Limitations  with chin tuck    Other Standing Exercises  Row, lat pulldown pink XTS x20 reps each      Modalities   Modalities  Ultrasound;Traction      Ultrasound   Ultrasound Location  R UT, Rhomboid    Ultrasound Parameters  Combo 1.5 w/cm2, 100%, 1 mhz x10 min    Ultrasound Goals  Pain      Traction   Type of Traction  Cervical    Min (lbs)  5    Max (lbs)  23    Hold Time  99    Rest Time  5    Time  15               PT Short Term Goals - 02/07/18 0954      PT SHORT TERM GOAL #1   Title  STG=LTG        PT Long  Term Goals - 02/16/18 1701      PT LONG TERM GOAL #1   Title  Patient will be independent with HEP and its progression    Time  6    Period  Weeks    Status  On-going      PT LONG TERM GOAL #2   Title  Patient will demonstrate 4+/5 or greater bilateral shoulder MMT to improve stabilization during functional tasks.    Time  6    Period  Weeks    Status  On-going      PT LONG TERM GOAL #3   Title  Patient will report ability to perform ADLS and work activities with cervical pain less than 3/10.    Time  6    Period  Weeks    Status  New            Plan - 02/28/18 1643    Clinical Impression Statement  Patient tolerated today's treatment well and arrived with no pain complaints only reports of pressure with lifting at work. Patient able to complete more postural strengthening exercises with resistance. Chin tucks also introduced in today's treatment and incorporated in standing postural exercises. Normal modalities response noted following removal of the modalities. Mechanical cervical traction maintained at 23# max.    Rehab Potential  Good    PT Frequency  2x / week    PT Duration  6 weeks    PT Treatment/Interventions  ADLs/Self Care Home Management;Cryotherapy;Ultrasound;Traction;Moist Heat;Electrical  Stimulation;Neuromuscular re-education;Therapeutic exercise;Therapeutic activities;Patient/family education;Manual techniques;Dry needling;Passive range of motion;Taping;Spinal Manipulations    PT Next Visit Plan  Max traction at 23#; add more postural exercises and neck strengthening exercises; modalities for pain relief.    PT Home Exercise Plan  see patient education     Consulted and Agree with Plan of Care  Patient       Patient will benefit from skilled therapeutic intervention in order to improve the following deficits and impairments:  Pain, Postural dysfunction, Decreased strength, Decreased range of motion  Visit Diagnosis: Cervicalgia  Abnormal posture     Problem List Patient Active Problem List   Diagnosis Date Noted  . Current smoker 01/26/2018  . Acute appendicitis 01/29/2013    Casey FullerKelsey P Kennon, PTA 02/28/2018, 4:53 PM  [facility] [address] [place], [place], [address] Phone: [phone]   Fax:  [phone]  Name: Casey Yang MRN: [id number] Date of Birth: 05-Apr-1970

## 2018-03-02 ENCOUNTER — Encounter: Payer: Self-pay | Admitting: Physical Therapy

## 2018-03-02 ENCOUNTER — Ambulatory Visit: Payer: Managed Care, Other (non HMO) | Admitting: Physical Therapy

## 2018-03-02 DIAGNOSIS — R293 Abnormal posture: Secondary | ICD-10-CM

## 2018-03-02 DIAGNOSIS — M542 Cervicalgia: Secondary | ICD-10-CM | POA: Diagnosis not present

## 2018-03-02 NOTE — Therapy (Signed)
[facility] [address] [place], [place], [address] Phone: [phone]   Fax:  [phone]  Physical Therapy Treatment  Patient Details  Name: Casey Yang MRN: [id number] Date of Birth: Dec 17, 1970 Referring Provider (PT): Jannifer Rodney, FNP   Encounter Date: 03/02/2018  PT End of Session - 03/02/18 1606    Visit Number  8    Number of Visits  12    Date for PT Re-Evaluation  03/28/18    Authorization Type  Progress note every 10th visit    PT Start Time  1605    PT Stop Time  1700    PT Time Calculation (min)  55 min    Activity Tolerance  Patient tolerated treatment well    Behavior During Therapy  [facility] for tasks assessed/performed       History reviewed. No pertinent past medical history.  Past Surgical History:  Procedure Laterality Date  . APPENDECTOMY    . LAPAROSCOPIC APPENDECTOMY N/A 01/29/2013   Procedure: APPENDECTOMY LAPAROSCOPIC;  Surgeon: Dalia Heading, MD;  Location: [facility];  Service: General;  Laterality: N/A;    There were no vitals filed for this visit.  Subjective Assessment - 03/02/18 1605    Subjective  Reports a pinch in his neck with any lifting at work. Rated about 2/10 when lifting.    Limitations  Lifting;Other (comment);House hold activities    Diagnostic tests  x-ray: mild degeneration (see imaging)    Patient Stated Goals  get rid of pain    Currently in Pain?  No/denies         [facility] PT Assessment - 03/02/18 0001      Assessment   Medical Diagnosis  neck pain without injury    Referring Provider (PT)  Jannifer Rodney, FNP    Next MD Visit  Unsure    Prior Therapy  no      Precautions   Precautions  None      Restrictions   Weight Bearing Restrictions  No                   [facility] Adult PT Treatment/Exercise - 03/02/18 0001      Exercises   Exercises  Neck;Shoulder      Neck Exercises: Machines for Strengthening   UBE (Upper Arm Bike)  60 RPM x8 min      Neck  Exercises: Standing   Wall Push Ups  20 reps    Upper Extremity D2  Flexion;10 reps;Theraband    Theraband Level (UE D2)  Level 2 (Red)    Other Standing Exercises  Row, lat pulldown, protraction pink XTS x30 reps each      Shoulder Exercises: Standing   Horizontal ABduction  Strengthening;Both;15 reps;Theraband    Theraband Level (Shoulder Horizontal ABduction)  Level 2 (Red)    Flexion  Strengthening;Both;20 reps;Weights    Shoulder Flexion Weight (lbs)  1    ABduction  Strengthening;Both;20 reps;Weights    Shoulder ABduction Weight (lbs)  1    Other Standing Exercises  B shoulder scaption 1# x20 reps      Modalities   Modalities  Ultrasound;Traction      Ultrasound   Ultrasound Location  R UT, rhomboid    Ultrasound Parameters  Combo 1.5 w/cm2, 100%, 1 mhz x10 min    Ultrasound Goals  Pain      Traction   Type of Traction  Cervical    Min (lbs)  5    Max (lbs)  23    Hold Time  99    Rest Time  5    Time  15               PT Short Term Goals - 02/07/18 0954      PT SHORT TERM GOAL #1   Title  STG=LTG        PT Long Term Goals - 02/16/18 1701      PT LONG TERM GOAL #1   Title  Patient will be independent with HEP and its progression    Time  6    Period  Weeks    Status  On-going      PT LONG TERM GOAL #2   Title  Patient will demonstrate 4+/5 or greater bilateral shoulder MMT to improve stabilization during functional tasks.    Time  6    Period  Weeks    Status  On-going      PT LONG TERM GOAL #3   Title  Patient will report ability to perform ADLS and work activities with cervical pain less than 3/10.    Time  6    Period  Weeks    Status  New            Plan - 03/02/18 1717    Clinical Impression Statement  Patient presented in clinic with no pain upon arrival but continues to report "pull" with lifting at work. Patient able to complete all therex with VCs for chin tucks with only complaint of shoulder fatigue. Minimal increased  inflammation of R UT today. Normal traction response to 23# max.    Rehab Potential  Good    PT Frequency  2x / week    PT Duration  6 weeks    PT Treatment/Interventions  ADLs/Self Care Home Management;Cryotherapy;Ultrasound;Traction;Moist Heat;Electrical Stimulation;Neuromuscular re-education;Therapeutic exercise;Therapeutic activities;Patient/family education;Manual techniques;Dry needling;Passive range of motion;Taping;Spinal Manipulations    PT Next Visit Plan  Max traction at 23#; add more postural exercises and neck strengthening exercises; modalities for pain relief.    PT Home Exercise Plan  see patient education     Consulted and Agree with Plan of Care  Patient       Patient will benefit from skilled therapeutic intervention in order to improve the following deficits and impairments:  Pain, Postural dysfunction, Decreased strength, Decreased range of motion  Visit Diagnosis: Cervicalgia  Abnormal posture     Problem List Patient Active Problem List   Diagnosis Date Noted  . Current smoker 01/26/2018  . Acute appendicitis 01/29/2013    Marvell Fuller, PTA 03/02/2018, 5:26 PM  [facility] [address] [place], [place], [address] Phone: [phone]   Fax:  [phone]  Name: MOXON BEHNEY MRN: [id number] Date of Birth: 09-Oct-1970

## 2018-03-07 ENCOUNTER — Ambulatory Visit: Payer: Managed Care, Other (non HMO) | Admitting: *Deleted

## 2018-03-09 ENCOUNTER — Ambulatory Visit: Payer: Managed Care, Other (non HMO) | Admitting: *Deleted

## 2018-03-09 DIAGNOSIS — M542 Cervicalgia: Secondary | ICD-10-CM

## 2018-03-09 DIAGNOSIS — R293 Abnormal posture: Secondary | ICD-10-CM

## 2018-03-09 NOTE — Therapy (Signed)
[facility] [place], [place], [address] Phone: [phone]   Fax:  [phone]  Physical Therapy Treatment  Patient Details  Name: Casey Yang MRN: [id number] Date of Birth: May 02, 1970 Referring Provider (PT): Evelina Dun, FNP   Encounter Date: 03/09/2018  PT End of Session - 03/09/18 1652    Visit Number  9    Number of Visits  12    Date for PT Re-Evaluation  03/28/18    Authorization Type  Progress note every 10th visit    PT Start Time  1600    PT Stop Time  1651    PT Time Calculation (min)  51 min       No past medical history on file.  Past Surgical History:  Procedure Laterality Date  . APPENDECTOMY    . LAPAROSCOPIC APPENDECTOMY N/A 01/29/2013   Procedure: APPENDECTOMY LAPAROSCOPIC;  Surgeon: Jamesetta So, MD;  Location: [facility];  Service: General;  Laterality: N/A;    There were no vitals filed for this visit.                    [facility] Adult PT Treatment/Exercise - 03/09/18 0001      Exercises   Exercises  Neck;Shoulder      Neck Exercises: Machines for Strengthening   UBE (Upper Arm Bike)  60 RPM x8 min      Neck Exercises: Standing   Other Standing Exercises  Row, lat pulldown, pink XTS x20 reps each hold 5 secs      Modalities   Modalities  Ultrasound;Traction      Ultrasound   Ultrasound Location  RT UT/ rhomboid    Ultrasound Parameters  Combo x 8 mins 1.5 w/cm2    Ultrasound Goals  Pain      Traction   Type of Traction  Cervical    Min (lbs)  5    Max (lbs)  23    Hold Time  99    Rest Time  5    Time  15               PT Short Term Goals - 02/07/18 0954      PT SHORT TERM GOAL #1   Title  STG=LTG        PT Long Term Goals - 03/09/18 1700      PT LONG TERM GOAL #1   Title  Patient will be independent with HEP and its progression    Time  6    Period  Weeks    Status  On-going      PT LONG TERM GOAL #2   Title  Patient will  demonstrate 4+/5 or greater bilateral shoulder MMT to improve stabilization during functional tasks.    Time  6    Period  Weeks    Status  On-going      PT LONG TERM GOAL #3   Title  Patient will report ability to perform ADLS and work activities with cervical pain less than 3/10.    Time  6    Period  Weeks    Status  Partially Met            Plan - 03/09/18 1653    Clinical Impression Statement  Pt arrived today fatigued from work , but doing better overall since starting PT. The pain has turned more into pressure and soreness and is intermittent in c-spine. He did well with therex today and traction  at 23#s again.    Clinical Presentation  Evolving    Rehab Potential  Good    PT Frequency  2x / week    PT Duration  6 weeks    PT Treatment/Interventions  ADLs/Self Care Home Management;Cryotherapy;Ultrasound;Traction;Moist Heat;Electrical Stimulation;Neuromuscular re-education;Therapeutic exercise;Therapeutic activities;Patient/family education;Manual techniques;Dry needling;Passive range of motion;Taping;Spinal Manipulations    PT Next Visit Plan  Max traction at 23#; add more postural exercises and neck strengthening exercises; modalities for pain relief.    PT Home Exercise Plan  see patient education     Consulted and Agree with Plan of Care  Patient       Patient will benefit from skilled therapeutic intervention in order to improve the following deficits and impairments:  Pain, Postural dysfunction, Decreased strength, Decreased range of motion  Visit Diagnosis: Cervicalgia  Abnormal posture     Problem List Patient Active Problem List   Diagnosis Date Noted  . Current smoker 01/26/2018  . Acute appendicitis 01/29/2013    RAMSEUR,CHRIS, PTA 03/09/2018, 5:12 PM  [facility] [address] [place], [place], [address] Phone: [phone]   Fax:  [phone]  Name: Casey Yang MRN: [id number] Date of Birth:  [birth year]

## 2018-03-14 ENCOUNTER — Encounter: Payer: Self-pay | Admitting: Physical Therapy

## 2018-03-14 ENCOUNTER — Ambulatory Visit: Payer: Managed Care, Other (non HMO) | Attending: Family | Admitting: Physical Therapy

## 2018-03-14 DIAGNOSIS — R293 Abnormal posture: Secondary | ICD-10-CM | POA: Diagnosis present

## 2018-03-14 DIAGNOSIS — M542 Cervicalgia: Secondary | ICD-10-CM

## 2018-03-14 NOTE — Therapy (Signed)
[facility] [address] [place], [place], [address] Phone: [phone]   Fax:  [phone]  Physical Therapy Treatment  Patient Details  Name: Casey Yang MRN: [id number] Date of Birth: April 24, 1970 Referring Provider (PT): Jannifer Rodneyhristy Hawks, FNP   Encounter Date: 03/14/2018  PT End of Session - 03/14/18 1610    Visit Number  10    Number of Visits  12    Date for PT Re-Evaluation  03/28/18    Authorization Type  Progress note every 10th visit    PT Start Time  1555    PT Stop Time  1652    PT Time Calculation (min)  57 min    Activity Tolerance  Patient tolerated treatment well    Behavior During Therapy  [facility] for tasks assessed/performed       History reviewed. No pertinent past medical history.  Past Surgical History:  Procedure Laterality Date  . APPENDECTOMY    . LAPAROSCOPIC APPENDECTOMY N/A 01/29/2013   Procedure: APPENDECTOMY LAPAROSCOPIC;  Surgeon: Dalia HeadingMark A Jenkins, MD;  Location: [facility];  Service: General;  Laterality: N/A;    There were no vitals filed for this visit.  Subjective Assessment - 03/14/18 1947    Subjective  Patient reports no pain but just pressure.     Limitations  Lifting;Other (comment);House hold activities    Diagnostic tests  x-ray: mild degeneration (see imaging)    Patient Stated Goals  get rid of pain    Currently in Pain?  No/denies         [facility] - 03/14/18 0001      Assessment   Medical Diagnosis  neck pain without injury    Referring Provider (PT)  Jannifer Rodneyhristy Hawks, FNP    [facility]                   [facility] Adult PT Treatment/Exercise - 03/14/18 0001      Exercises   Exercises  Neck;Shoulder      Neck Exercises: Machines for Strengthening   UBE (Upper Arm Bike)  90 RPM x8 min      Neck Exercises: Theraband   Horizontal ABduction  20 reps;Green      Neck Exercises: Standing   Other Standing Exercises  Row, lat pulldown, pink XTS x20 reps each  hold 5 secs      Shoulder Exercises: Standing   Diagonals  Strengthening;Both;20 reps;Theraband    Theraband Level (Shoulder Diagonals)  Level 3 (Green)      Shoulder Exercises: ROM/Strengthening   Cybex Row  Other (comment)   50# 2x10 5" hold     Ultrasound   Ultrasound Location  Right UT/ Rhomboid    Ultrasound Parameters  Combo x8 minutes    Ultrasound Goals  Pain      Traction   Type of Traction  Cervical    Min (lbs)  5    Max (lbs)  23    Hold Time  99    Rest Time  5    Time  15               PT Short Term Goals - 02/07/18 0954      PT SHORT TERM GOAL #1   Title  STG=LTG        PT Long Term Goals - 03/14/18 1611      PT LONG TERM GOAL #1   Title  Patient will be independent with HEP and its progression  Time  6    Period  Weeks    Status  Achieved      PT LONG TERM GOAL #2   Title  Patient will demonstrate 4+/5 or greater bilateral shoulder MMT to improve stabilization during functional tasks.    Time  6    Period  Weeks    Status  On-going      PT LONG TERM GOAL #3   Title  Patient will report ability to perform ADLS and work activities with cervical pain less than 3/10.    Time  6    Period  Weeks    Status  Achieved            Plan - 03/14/18 1950    Clinical Impression Statement  Patient was able to tolerate progression of treatment well with minimal reports of pain. Traction maintained at 23# pull weight and noted with normal response to combo e-stim/US.    Clinical Presentation  Evolving    Clinical Decision Making  Low    Rehab Potential  Good    PT Frequency  2x / week    PT Duration  6 weeks    PT Treatment/Interventions  ADLs/Self Care Home Management;Cryotherapy;Ultrasound;Traction;Moist Heat;Electrical Stimulation;Neuromuscular re-education;Therapeutic exercise;Therapeutic activities;Patient/family education;Manual techniques;Dry needling;Passive range of motion;Taping;Spinal Manipulations    PT Next Visit Plan  Max  traction at 23#; add more postural exercises and neck strengthening exercises; modalities for pain relief.    Consulted and Agree with Plan of Care  Patient       Patient will benefit from skilled therapeutic intervention in order to improve the following deficits and impairments:  Pain, Postural dysfunction, Decreased strength, Decreased range of motion  Visit Diagnosis: Cervicalgia  Abnormal posture     Problem List Patient Active Problem List   Diagnosis Date Noted  . Current smoker 01/26/2018  . Acute appendicitis 01/29/2013   Guss Bunde, PT, DPT 03/14/2018, 8:01 PM  [facility] [address] [place], [place], [address] Phone: [phone]   Fax:  [phone]  Name: Casey Yang MRN: [id number] Date of Birth: [birth year]

## 2018-03-16 ENCOUNTER — Encounter: Payer: Self-pay | Admitting: Physical Therapy

## 2018-03-16 ENCOUNTER — Ambulatory Visit: Payer: Managed Care, Other (non HMO) | Admitting: Physical Therapy

## 2018-03-16 DIAGNOSIS — M542 Cervicalgia: Secondary | ICD-10-CM | POA: Diagnosis not present

## 2018-03-16 NOTE — Therapy (Signed)
[facility] [place], [place], [address] Phone: [phone]   Fax:  [phone]  Physical Therapy Treatment  Patient Details  Name: Casey Yang MRN: [id number] Date of Birth: April 04, 1970 Referring Provider (PT): Evelina Dun, FNP   Encounter Date: 03/16/2018  PT End of Session - 03/16/18 1627    Visit Number  11    Number of Visits  12    Date for PT Re-Evaluation  03/28/18    Authorization Type  Progress note every 10th visit    PT Start Time  0346    PT Stop Time  0442    PT Time Calculation (min)  56 min    Activity Tolerance  Patient tolerated treatment well    Behavior During Therapy  [facility] for tasks assessed/performed       History reviewed. No pertinent past medical history.  Past Surgical History:  Procedure Laterality Date  . APPENDECTOMY    . LAPAROSCOPIC APPENDECTOMY N/A 01/29/2013   Procedure: APPENDECTOMY LAPAROSCOPIC;  Surgeon: Jamesetta So, MD;  Location: [facility];  Service: General;  Laterality: N/A;    There were no vitals filed for this visit.  Subjective Assessment - 03/16/18 1628    Subjective  Little increase in pain today from work but otherwise doing very well.    Limitations  Lifting;Other (comment);House hold activities    Diagnostic tests  x-ray: mild degeneration (see imaging)    Patient Stated Goals  get rid of pain    Currently in Pain?  Yes    Pain Score  2     Pain Location  Neck    Pain Orientation  Posterior;Lower    Pain Descriptors / Indicators  Sore;Aching                       [facility] Adult PT Treatment/Exercise - 03/16/18 0001      Modalities   Modalities  Traction;Ultrasound      Ultrasound   Ultrasound Location  Cervical.    Ultrasound Parameters  Combo e'stim/U/S at 1.50 W/CM2 x12 minutes.    Ultrasound Goals  Pain      Traction   Type of Traction  Cervical    Min (lbs)  5    Max (lbs)  23    Hold Time  99    Rest Time  5    Time  15      Manual Therapy   Manual Therapy  Soft tissue mobilization    Manual therapy comments  STW/M x 12 minutes to cervical musculature.               PT Short Term Goals - 02/07/18 0954      PT SHORT TERM GOAL #1   Title  STG=LTG        PT Long Term Goals - 03/16/18 1624      PT LONG TERM GOAL #1   Title  Patient will be independent with HEP and its progression    Time  6    Period  Weeks    Status  Achieved      PT LONG TERM GOAL #2   Title  Patient will demonstrate 4+/5 or greater bilateral shoulder MMT to improve stabilization during functional tasks.    Time  6    Period  Weeks    Status  Achieved      PT LONG TERM GOAL #3   Title  Patient will report ability to  perform ADLS and work activities with cervical pain less than 3/10.    Time  6    Period  Weeks    Status  Achieved            Plan - 03/16/18 1638    Clinical Impression Statement  Please see discharge summary.    PT Treatment/Interventions  ADLs/Self Care Home Management;Cryotherapy;Ultrasound;Traction;Moist Heat;Electrical Stimulation;Neuromuscular re-education;Therapeutic exercise;Therapeutic activities;Patient/family education;Manual techniques;Dry needling;Passive range of motion;Taping;Spinal Manipulations    PT Next Visit Plan  Max traction at 23#; add more postural exercises and neck strengthening exercises; modalities for pain relief.    PT Home Exercise Plan  see patient education     Consulted and Agree with Plan of Care  Patient       Patient will benefit from skilled therapeutic intervention in order to improve the following deficits and impairments:  Pain, Postural dysfunction, Decreased strength, Decreased range of motion  Visit Diagnosis: Cervicalgia     Problem List Patient Active Problem List   Diagnosis Date Noted  . Current smoker 01/26/2018  . Acute appendicitis 01/29/2013   PHYSICAL THERAPY DISCHARGE SUMMARY  Visits from Start of Care: 11.  Current functional  level related to goals / functional outcomes: See above.   Remaining deficits: All goals met.   Education / Equipment: HEP. Plan: Patient agrees to discharge.  Patient goals were met. Patient is being discharged due to meeting the stated rehab goals.  ?????      [place], [place] MPT 03/16/2018, 4:43 PM  [facility] [address] [place], [place], [address] Phone: [phone]   Fax:  [phone]  Name: ISAIAS DOWSON MRN: [id number] Date of Birth: 1970/11/13

## 2018-06-27 ENCOUNTER — Encounter (INDEPENDENT_AMBULATORY_CARE_PROVIDER_SITE_OTHER): Payer: Self-pay

## 2018-06-27 ENCOUNTER — Telehealth: Payer: Self-pay | Admitting: Family

## 2019-02-04 ENCOUNTER — Emergency Department (HOSPITAL_COMMUNITY)
Admission: EM | Admit: 2019-02-04 | Discharge: 2019-02-04 | Disposition: A | Discharge status: Home / Self Care | Payer: Managed Care, Other (non HMO) | Attending: Emergency Medicine | Admitting: Emergency Medicine

## 2019-02-04 ENCOUNTER — Encounter (HOSPITAL_COMMUNITY): Payer: Self-pay

## 2019-02-04 ENCOUNTER — Other Ambulatory Visit: Payer: Self-pay

## 2019-02-04 ENCOUNTER — Emergency Department (HOSPITAL_COMMUNITY): Payer: Managed Care, Other (non HMO)

## 2019-02-04 DIAGNOSIS — M79662 Pain in left lower leg: Secondary | ICD-10-CM | POA: Insufficient documentation

## 2019-02-04 DIAGNOSIS — I10 Essential (primary) hypertension: Secondary | ICD-10-CM | POA: Diagnosis not present

## 2019-02-04 DIAGNOSIS — Z79899 Other long term (current) drug therapy: Secondary | ICD-10-CM | POA: Diagnosis not present

## 2019-02-04 DIAGNOSIS — F17210 Nicotine dependence, cigarettes, uncomplicated: Secondary | ICD-10-CM | POA: Diagnosis not present

## 2019-02-04 DIAGNOSIS — M79605 Pain in left leg: Secondary | ICD-10-CM

## 2019-02-04 HISTORY — DX: Essential (primary) hypertension: I10

## 2019-02-04 MED ORDER — IBUPROFEN 800 MG PO TABS
800.0000 mg | ORAL_TABLET | Freq: Once | ORAL | Status: AC
  Administered 2019-02-04: 22:00:00 800 mg via ORAL
  Filled 2019-02-04: qty 1

## 2019-02-04 MED ORDER — IBUPROFEN 800 MG PO TABS: 800.0000 mg | ORAL_TABLET | Freq: Three times a day (TID) | ORAL | 0 refills | Status: DC | PRN

## 2019-02-04 NOTE — ED Provider Notes (Signed)
[facility] EMERGENCY DEPARTMENT Provider Note   CSN: [id number] Arrival date & time: 02/04/19  1743     History Chief Complaint  Patient presents with  . Leg Pain    Casey Yang is a 48 y.o. male.  Patient states while he was walking he felt a pop in his left calf and started to have pain.  The history is provided by the patient. No language interpreter was used.  Leg Pain Lower extremity pain location: Left calf. Injury: yes   Mechanism of injury comment:  Walking Pain details:    Quality:  Aching   Radiates to:  Does not radiate   Severity:  Moderate   Onset quality:  Sudden Associated symptoms: no back pain and no fatigue        Past Medical History:  Diagnosis Date  . Hypertension     Patient Active Problem List   Diagnosis Date Noted  . Current smoker 01/26/2018  . Acute appendicitis 01/29/2013    Past Surgical History:  Procedure Laterality Date  . APPENDECTOMY    . LAPAROSCOPIC APPENDECTOMY N/A 01/29/2013   Procedure: APPENDECTOMY LAPAROSCOPIC;  Surgeon: Jamesetta So, MD;  Location: [facility];  Service: General;  Laterality: N/A;       Family History  Problem Relation Age of Onset  . Cancer Mother        stomach  . Heart disease Father        Heart attack    Social History   Tobacco Use  . Smoking status: Current Every Day Smoker    Packs/day: 1.00    Types: Cigarettes  . Smokeless tobacco: Current User    Types: Snuff  Substance Use Topics  . Alcohol use: No  . Drug use: No    Home Medications Prior to Admission medications   Medication Sig Start Date End Date Taking? Authorizing Provider  baclofen (LIORESAL) 10 MG tablet Take 1 tablet (10 mg total) by mouth 3 (three) times daily. 01/26/18   Sharion Balloon, FNP  diclofenac (VOLTAREN) 75 MG EC tablet Take 1 tablet (75 mg total) by mouth 2 (two) times daily. 01/26/18   Evelina Dun A, FNP  hydrochlorothiazide (HYDRODIURIL) 12.5 MG tablet Take 1 tablet (12.5 mg total) by  mouth daily. 02/16/18   Evelina Dun A, FNP  ibuprofen (ADVIL) 800 MG tablet Take 1 tablet (800 mg total) by mouth every 8 (eight) hours as needed. 02/04/19   Milton Ferguson, MD    Allergies    Penicillins  Review of Systems   Review of Systems  Constitutional: Negative for appetite change and fatigue.  HENT: Negative for congestion, ear discharge and sinus pressure.   Eyes: Negative for discharge.  Respiratory: Negative for cough.   Cardiovascular: Negative for chest pain.  Gastrointestinal: Negative for abdominal pain and diarrhea.  Genitourinary: Negative for frequency and hematuria.  Musculoskeletal: Negative for back pain.       Pain in left calf  Skin: Negative for rash.  Neurological: Negative for seizures and headaches.  Psychiatric/Behavioral: Negative for hallucinations.    Physical Exam Updated Vital Signs BP (!) 172/102 (BP Location: Right Arm)   Pulse 85   Temp 99.2 F (37.3 C) (Oral)   Resp 18   Ht 5\' 7"  (1.702 m)   Wt 68 kg   SpO2 97%   BMI 23.49 kg/m   Physical Exam Vitals and nursing note reviewed.  Constitutional:      Appearance: He is well-developed.  HENT:  Head: Normocephalic.     Nose: Nose normal.  Eyes:     Conjunctiva/sclera: Conjunctivae normal.  Neck:     Trachea: No tracheal deviation.  Cardiovascular:     Rate and Rhythm: Normal rate.  Pulmonary:     Effort: Pulmonary effort is normal.  Musculoskeletal:        General: Normal range of motion.     Cervical back: Normal range of motion.     Comments: Tenderness to left calf.  Skin:    General: Skin is warm.  Neurological:     Mental Status: He is alert and oriented to person, place, and time.     ED Results / Procedures / Treatments   Labs (all labs ordered are listed, but only abnormal results are displayed) Labs Reviewed - No data to display  EKG None  Radiology DG Tibia/Fibula Left  Result Date: 02/04/2019 CLINICAL DATA:  48 year old male with right lower  extremity pain. EXAM: LEFT TIBIA AND FIBULA - 2 VIEW COMPARISON:  None. FINDINGS: There is no acute fracture or dislocation. Old healed distal tibial diaphysis fracture deformity. No arthritic changes. The soft tissues are unremarkable. IMPRESSION: No acute fracture or dislocation. Electronically Signed   By: Elgie Collard M.D.   On: 02/04/2019 18:40    Procedures Procedures (including critical care time)  Medications Ordered in ED Medications  ibuprofen (ADVIL) tablet 800 mg (has no administration in time range)    ED Course  I have reviewed the triage vital signs and the nursing notes.  Pertinent labs & imaging results that were available during my care of the patient were reviewed by me and considered in my medical decision making (see chart for details).    MDM Rules/Calculators/A&P                      Left calf pain.  Pain started suddenly when walking.  Suspect muscle strain.  Patient put on Motrin will follow-up with Dr. Romeo Apple Final Clinical Impression(s) / ED Diagnoses Final diagnoses:  Pain of left lower extremity    Rx / DC Orders ED Discharge Orders         Ordered    ibuprofen (ADVIL) 800 MG tablet  Every 8 hours PRN     02/04/19 2104           Bethann Berkshire, MD 02/04/19 2109

## 2019-02-04 NOTE — Discharge Instructions (Addendum)
Stay off your leg for 2 days and follow-up with Dr. Aline Brochure

## 2019-02-04 NOTE — ED Triage Notes (Signed)
Pt presents to ED with complaints of left leg pain. Pt states he was walking down steps and heard a pop.

## 2019-07-24 ENCOUNTER — Ambulatory Visit: Payer: Managed Care, Other (non HMO) | Admitting: Family

## 2019-07-24 ENCOUNTER — Encounter: Payer: Self-pay | Admitting: Family

## 2019-07-24 ENCOUNTER — Other Ambulatory Visit: Payer: Self-pay

## 2019-07-24 VITALS — BP 132/88 | HR 98 | Temp 98.3°F | Ht 67.0 in | Wt 143.8 lb

## 2019-07-24 DIAGNOSIS — Z1211 Encounter for screening for malignant neoplasm of colon: Secondary | ICD-10-CM

## 2019-07-24 DIAGNOSIS — I10 Essential (primary) hypertension: Secondary | ICD-10-CM | POA: Insufficient documentation

## 2019-07-24 DIAGNOSIS — Z0001 Encounter for general adult medical examination with abnormal findings: Secondary | ICD-10-CM

## 2019-07-24 DIAGNOSIS — R42 Dizziness and giddiness: Secondary | ICD-10-CM | POA: Diagnosis not present

## 2019-07-24 DIAGNOSIS — Z0000 Encounter for general adult medical examination without abnormal findings: Secondary | ICD-10-CM

## 2019-07-24 DIAGNOSIS — F17200 Nicotine dependence, unspecified, uncomplicated: Secondary | ICD-10-CM | POA: Diagnosis not present

## 2019-07-24 DIAGNOSIS — M1990 Unspecified osteoarthritis, unspecified site: Secondary | ICD-10-CM

## 2019-07-24 DIAGNOSIS — Z1159 Encounter for screening for other viral diseases: Secondary | ICD-10-CM

## 2019-07-24 MED ORDER — AMLODIPINE BESYLATE 5 MG PO TABS: 5.0000 mg | ORAL_TABLET | Freq: Every day | ORAL | 4 refills | Status: DC

## 2019-07-24 MED ORDER — MECLIZINE HCL 25 MG PO TABS: 25.0000 mg | ORAL_TABLET | Freq: Three times a day (TID) | ORAL | 4 refills | Status: DC | PRN

## 2019-07-24 MED ORDER — DICLOFENAC SODIUM 75 MG PO TBEC: 75.0000 mg | DELAYED_RELEASE_TABLET | Freq: Two times a day (BID) | ORAL | 3 refills | Status: DC

## 2019-07-24 NOTE — Progress Notes (Signed)
Subjective:    Patient ID: Casey Yang, male    DOB: 1970/05/25, 49 y.o.   MRN: [id number]  Chief Complaint  Patient presents with  . Hypertension  . Numbness    IN LEGS AND ARMS SINCE STARTED AMLODIPINE, MECLIZINE   . Dizziness   Pt presents to the office today for CPE. He states he had not been taking his BP medications regularly, but last month he started feeling weak and dizzy, and vision changes. He went to the Urgent care and found to have HTN ad was restarted on his medications. He has since been taking it regularly and has been feeling better now.   He was also diagnosed with vertigo and takes Antivert as needed.  Hypertension This is a chronic problem. The current episode started more than 1 year ago. The problem has been resolved since onset. The problem is controlled. Associated symptoms include malaise/fatigue. Pertinent negatives include no peripheral edema or shortness of breath. Past treatments include calcium channel blockers. The current treatment provides moderate improvement. Hypertensive end-organ damage includes CVA.  Dizziness This is a new problem. The current episode started more than 1 month ago. The problem occurs intermittently. The problem has been gradually improving. The symptoms are aggravated by twisting and bending. The treatment provided mild relief.  Arthritis Presents for initial visit. The disease course has been stable. He complains of stiffness. Affected locations include the right hip and left hip. His pain is at a severity of 7/10.      Review of Systems  Constitutional: Positive for malaise/fatigue.  Respiratory: Negative for shortness of breath.   Musculoskeletal: Positive for arthritis and stiffness.  Neurological: Positive for dizziness.  All other systems reviewed and are negative.  Family History  Problem Relation Age of Onset  . Cancer Mother        stomach  . Heart disease Father        Heart attack   Social History    Socioeconomic History  . Marital status: Divorced    Spouse name: Not on file  . Number of children: Not on file  . Years of education: Not on file  . Highest education level: Not on file  Occupational History  . Not on file  Tobacco Use  . Smoking status: Current Every Day Smoker    Packs/day: 1.00    Types: Cigarettes  . Smokeless tobacco: Current User    Types: Snuff  Vaping Use  . Vaping Use: Never used  Substance and Sexual Activity  . Alcohol use: No  . Drug use: No  . Sexual activity: Not on file  Other Topics Concern  . Not on file  Social History Narrative  . Not on file   Social Determinants of Health   Financial Resource Strain:   . Difficulty of Paying Living Expenses:   Food Insecurity:   . Worried About Charity fundraiser in the Last Year:   . Arboriculturist in the Last Year:   Transportation Needs:   . Film/video editor (Medical):   Marland Kitchen Lack of Transportation (Non-Medical):   Physical Activity:   . Days of Exercise per Week:   . Minutes of Exercise per Session:   Stress:   . Feeling of Stress :   Social Connections:   . Frequency of Communication with Friends and Family:   . Frequency of Social Gatherings with Friends and Family:   . Attends Religious Services:   . Active Member of Clubs  or Organizations:   . Attends Archivist Meetings:   Marland Kitchen Marital Status:        Objective:   Physical Exam Vitals reviewed.  Constitutional:      General: He is not in acute distress.    Appearance: He is well-developed.  HENT:     Head: Normocephalic.     Right Ear: Tympanic membrane normal.     Left Ear: Tympanic membrane normal.  Eyes:     General:        Right eye: No discharge.        Left eye: No discharge.     Pupils: Pupils are equal, round, and reactive to light.  Neck:     Thyroid: No thyromegaly.  Cardiovascular:     Rate and Rhythm: Normal rate and regular rhythm.     Heart sounds: Normal heart sounds. No murmur heard.    Pulmonary:     Effort: Pulmonary effort is normal. No respiratory distress.     Breath sounds: Normal breath sounds. No wheezing.  Abdominal:     General: Bowel sounds are normal. There is no distension.     Palpations: Abdomen is soft.     Tenderness: There is no abdominal tenderness.  Musculoskeletal:        General: No tenderness. Normal range of motion.     Cervical back: Normal range of motion and neck supple.  Skin:    General: Skin is warm and dry.     Findings: No erythema or rash.  Neurological:     Mental Status: He is alert and oriented to person, place, and time.     Cranial Nerves: No cranial nerve deficit.     Deep Tendon Reflexes: Reflexes are normal and symmetric.  Psychiatric:        Behavior: Behavior normal.        Thought Content: Thought content normal.        Judgment: Judgment normal.          BP 132/88   Pulse 98   Temp 98.3 F (36.8 C) (Temporal)   Ht '5\' 7"'$  (1.702 m)   Wt 143 lb 12.8 oz (65.2 kg)   SpO2 99%   BMI 22.52 kg/m   Assessment & Plan:  Casey Yang comes in today with chief complaint of Hypertension, Numbness (IN LEGS AND ARMS SINCE STARTED AMLODIPINE, MECLIZINE ), and Dizziness   Diagnosis and orders addressed:  1. Annual physical exam - CMP14+EGFR - CBC with Differential/Platelet - Lipid panel - PSA, total and free - TSH - Hepatitis C antibody  2. Essential hypertension - CMP14+EGFR - CBC with Differential/Platelet - amLODipine (NORVASC) 5 MG tablet; Take 1 tablet (5 mg total) by mouth daily.  Dispense: 90 tablet; Refill: 4  3. Current smoker Smoking cessation  - CMP14+EGFR - CBC with Differential/Platelet  4. Vertigo Fall precautions discussed - CMP14+EGFR - CBC with Differential/Platelet - meclizine (ANTIVERT) 25 MG tablet; Take 1 tablet (25 mg total) by mouth 3 (three) times daily as needed.  Dispense: 90 tablet; Refill: 4  5. Colon cancer screening - CMP14+EGFR - CBC with Differential/Platelet -  Ambulatory referral to Gastroenterology  6. Need for hepatitis C screening test - CMP14+EGFR - CBC with Differential/Platelet - Hepatitis C antibody  7. Arthritis - diclofenac (VOLTAREN) 75 MG EC tablet; Take 1 tablet (75 mg total) by mouth 2 (two) times daily.  Dispense: 60 tablet; Refill: 3   Labs pending Health Maintenance reviewed Diet and exercise encouraged  Follow up plan: 1 year    Evelina Dun, FNP

## 2019-07-24 NOTE — Patient Instructions (Signed)

## 2019-07-25 LAB — CBC WITH DIFFERENTIAL/PLATELET
Basophils Absolute: 0.2 10*3/uL (ref 0.0–0.2)
Basos: 3 %
EOS (ABSOLUTE): 0.1 10*3/uL (ref 0.0–0.4)
Eos: 2 %
Hematocrit: 31 % — ABNORMAL LOW (ref 37.5–51.0)
Hemoglobin: 9.5 g/dL — ABNORMAL LOW (ref 13.0–17.7)
Immature Grans (Abs): 0 10*3/uL (ref 0.0–0.1)
Immature Granulocytes: 0 %
Lymphocytes Absolute: 1.6 10*3/uL (ref 0.7–3.1)
Lymphs: 32 %
MCH: 20.2 pg — ABNORMAL LOW (ref 26.6–33.0)
MCHC: 30.6 g/dL — ABNORMAL LOW (ref 31.5–35.7)
MCV: 66 fL — ABNORMAL LOW (ref 79–97)
Monocytes Absolute: 0.8 10*3/uL (ref 0.1–0.9)
Monocytes: 15 %
Neutrophils Absolute: 2.5 10*3/uL (ref 1.4–7.0)
Neutrophils: 48 %
Platelets: 303 10*3/uL (ref 150–450)
RBC: 4.71 x10E6/uL (ref 4.14–5.80)
RDW: 17 % — ABNORMAL HIGH (ref 11.6–15.4)
WBC: 5.1 10*3/uL (ref 3.4–10.8)

## 2019-07-25 LAB — PSA, TOTAL AND FREE
PSA, Free Pct: 26.4 %
PSA, Free: 1.19 ng/mL
Prostate Specific Ag, Serum: 4.5 ng/mL — ABNORMAL HIGH (ref 0.0–4.0)

## 2019-07-25 LAB — LIPID PANEL
Chol/HDL Ratio: 2.7 ratio (ref 0.0–5.0)
Cholesterol, Total: 206 mg/dL — ABNORMAL HIGH (ref 100–199)
HDL: 76 mg/dL (ref >39)
LDL Chol Calc (NIH): 103 mg/dL — ABNORMAL HIGH (ref 0–99)
Triglycerides: 158 mg/dL — ABNORMAL HIGH (ref 0–149)
VLDL Cholesterol Cal: 27 mg/dL (ref 5–40)

## 2019-07-25 LAB — CMP14+EGFR
ALT: 26 IU/L (ref 0–44)
AST: 42 IU/L — ABNORMAL HIGH (ref 0–40)
Albumin/Globulin Ratio: 1.6 (ref 1.2–2.2)
Albumin: 4.3 g/dL (ref 4.0–5.0)
Alkaline Phosphatase: 62 IU/L (ref 48–121)
BUN/Creatinine Ratio: 12 (ref 9–20)
BUN: 10 mg/dL (ref 6–24)
Bilirubin Total: 0.2 mg/dL (ref 0.0–1.2)
CO2: 23 mmol/L (ref 20–29)
Calcium: 8.9 mg/dL (ref 8.7–10.2)
Chloride: 104 mmol/L (ref 96–106)
Creatinine, Ser: 0.83 mg/dL (ref 0.76–1.27)
GFR calc Af Amer: 119 mL/min/{1.73_m2} (ref >59)
GFR calc non Af Amer: 103 mL/min/{1.73_m2} (ref >59)
Globulin, Total: 2.7 g/dL (ref 1.5–4.5)
Glucose: 93 mg/dL (ref 65–99)
Potassium: 4.5 mmol/L (ref 3.5–5.2)
Sodium: 141 mmol/L (ref 134–144)
Total Protein: 7 g/dL (ref 6.0–8.5)

## 2019-07-25 LAB — TSH: TSH: 0.568 u[IU]/mL (ref 0.450–4.500)

## 2019-07-25 LAB — HEPATITIS C ANTIBODY: Hep C Virus Ab: <0.1 s/co ratio (ref 0.0–0.9)

## 2019-07-26 ENCOUNTER — Other Ambulatory Visit: Payer: Self-pay | Admitting: Family

## 2019-07-26 DIAGNOSIS — R972 Elevated prostate specific antigen [PSA]: Secondary | ICD-10-CM

## 2019-07-26 DIAGNOSIS — E785 Hyperlipidemia, unspecified: Secondary | ICD-10-CM | POA: Insufficient documentation

## 2019-07-26 MED ORDER — ATORVASTATIN CALCIUM 20 MG PO TABS: 20.0000 mg | ORAL_TABLET | Freq: Every day | ORAL | 3 refills | Status: DC

## 2019-07-31 ENCOUNTER — Encounter: Payer: Self-pay | Admitting: Internal Medicine

## 2019-09-27 ENCOUNTER — Ambulatory Visit: Payer: Managed Care, Other (non HMO)

## 2019-10-17 ENCOUNTER — Other Ambulatory Visit: Payer: Self-pay

## 2019-10-17 ENCOUNTER — Emergency Department (HOSPITAL_COMMUNITY)
Admission: EM | Admit: 2019-10-17 | Discharge: 2019-10-17 | Disposition: A | Discharge status: Home / Self Care | Payer: Managed Care, Other (non HMO) | Attending: Emergency Medicine | Admitting: Emergency Medicine

## 2019-10-17 ENCOUNTER — Encounter (HOSPITAL_COMMUNITY): Payer: Self-pay

## 2019-10-17 ENCOUNTER — Emergency Department (HOSPITAL_COMMUNITY): Payer: Managed Care, Other (non HMO)

## 2019-10-17 DIAGNOSIS — M25511 Pain in right shoulder: Secondary | ICD-10-CM | POA: Diagnosis not present

## 2019-10-17 DIAGNOSIS — I10 Essential (primary) hypertension: Secondary | ICD-10-CM | POA: Insufficient documentation

## 2019-10-17 DIAGNOSIS — F17210 Nicotine dependence, cigarettes, uncomplicated: Secondary | ICD-10-CM | POA: Insufficient documentation

## 2019-10-17 MED ORDER — TRAMADOL HCL 50 MG PO TABS: 50.0000 mg | ORAL_TABLET | Freq: Four times a day (QID) | ORAL | 0 refills | Status: DC | PRN

## 2019-10-17 MED ORDER — IBUPROFEN 400 MG PO TABS
400.0000 mg | ORAL_TABLET | Freq: Once | ORAL | Status: AC
  Administered 2019-10-17: 400 mg via ORAL
  Filled 2019-10-17: qty 1

## 2019-10-17 MED ORDER — PREDNISONE 20 MG PO TABS: ORAL_TABLET | ORAL | 0 refills | Status: DC

## 2019-10-17 MED ORDER — ACETAMINOPHEN 500 MG PO TABS
1000.0000 mg | ORAL_TABLET | Freq: Once | ORAL | Status: DC
  Filled 2019-10-17: qty 2

## 2019-10-17 NOTE — ED Triage Notes (Signed)
Pt reports chronic bilateral shoulder pain.  Reports R shoulder hurts worse than left.

## 2019-10-17 NOTE — ED Provider Notes (Signed)
[facility] EMERGENCY DEPARTMENT Provider Note   CSN: [id number] Arrival date & time: 10/17/19  4081     History Chief Complaint  Patient presents with  . Shoulder Pain    Casey Yang is a 49 y.o. male.  Patient c/o right shoulder pain in the past few days. Symptoms gradual onset, moderate-severe, constant, dull, non radiating, worse w active rom shoulder/abduction shoulder. Denies radicular pain. No arm numbness or weakness. No loss of normal function of arm other than the limitations due to pain in shoulder. No neck pain. Denies specific injury. No trauma or fall. No fever or chills. No skin changes or redness. No arm swelling. States hx chronic intermittent shoulder pain in past, and unspecific rotator cuff issues in past. Uses both arms, no definite dominant arm.   The history is provided by the patient.  Shoulder Pain Associated symptoms: no fever and no neck pain        Past Medical History:  Diagnosis Date  . Hypertension     Patient Active Problem List   Diagnosis Date Noted  . Hyperlipidemia 07/26/2019  . Hypertension 07/24/2019  . Vertigo 07/24/2019  . Current smoker 01/26/2018    Past Surgical History:  Procedure Laterality Date  . APPENDECTOMY    . LAPAROSCOPIC APPENDECTOMY N/A 01/29/2013   Procedure: APPENDECTOMY LAPAROSCOPIC;  Surgeon: Dalia Heading, MD;  Location: [facility];  Service: General;  Laterality: N/A;       Family History  Problem Relation Age of Onset  . Cancer Mother        stomach  . Heart disease Father        Heart attack    Social History   Tobacco Use  . Smoking status: Current Every Day Smoker    Packs/day: 1.00    Types: Cigarettes  . Smokeless tobacco: Current User    Types: Snuff  Vaping Use  . Vaping Use: Never used  Substance Use Topics  . Alcohol use: No  . Drug use: No    Home Medications Prior to Admission medications   Medication Sig Start Date End Date Taking? Authorizing Provider  amLODipine  (NORVASC) 5 MG tablet Take 1 tablet (5 mg total) by mouth daily. 07/24/19 07/23/20  Junie Spencer, FNP  atorvastatin (LIPITOR) 20 MG tablet Take 1 tablet (20 mg total) by mouth daily. 07/26/19   Junie Spencer, FNP  diclofenac (VOLTAREN) 75 MG EC tablet Take 1 tablet (75 mg total) by mouth 2 (two) times daily. 07/24/19   Jannifer Rodney A, FNP  ibuprofen (ADVIL) 800 MG tablet Take 1 tablet (800 mg total) by mouth every 8 (eight) hours as needed. 02/04/19   Bethann Berkshire, MD  meclizine (ANTIVERT) 25 MG tablet Take 1 tablet (25 mg total) by mouth 3 (three) times daily as needed. 07/24/19   Junie Spencer, FNP    Allergies    Penicillins  Review of Systems   Review of Systems  Constitutional: Negative for diaphoresis and fever.  HENT: Negative for sore throat.   Eyes: Negative for redness.  Respiratory: Negative for shortness of breath.   Cardiovascular: Negative for chest pain.  Gastrointestinal: Negative for abdominal pain, nausea and vomiting.  Genitourinary: Negative for flank pain.  Musculoskeletal: Negative for neck pain.  Skin: Negative for rash.  Neurological: Negative for weakness, numbness and headaches.  Hematological: Does not bruise/bleed easily.  Psychiatric/Behavioral: Negative for confusion.    Physical Exam Updated Vital Signs BP (!) 135/95 (BP Location: Left Arm)  Pulse 96   Temp 98.1 F (36.7 C) (Oral)   Resp 18   Ht 1.727 m (5\' 8" )   Wt 63.5 kg   SpO2 100%   BMI 21.29 kg/m   Physical Exam Vitals and nursing note reviewed.  Constitutional:      Appearance: Normal appearance. He is well-developed.  HENT:     Head: Atraumatic.     Nose: Nose normal.     Mouth/Throat:     Mouth: Mucous membranes are moist.  Eyes:     General: No scleral icterus.    Conjunctiva/sclera: Conjunctivae normal.  Neck:     Trachea: No tracheal deviation.  Cardiovascular:     Rate and Rhythm: Normal rate.     Pulses: Normal pulses.  Pulmonary:     Effort: Pulmonary  effort is normal. No accessory muscle usage or respiratory distress.  Abdominal:     General: There is no distension.     Tenderness: There is no abdominal tenderness.  Genitourinary:    Comments: No cva tenderness. Musculoskeletal:        General: No swelling.     Cervical back: Normal range of motion and neck supple. No rigidity or tenderness.     Comments: Tenderness right shoulder. No sts or skin changes noted. No deformity. No erythema or increased warmth to shoulder. With active rom shoulder, esp abduction, pain in shoulder. No pain w slow/passive rom. Radial pulse 2+. No arm or shoulder swelling noted. C/T spine non tender, aligned.   Skin:    General: Skin is warm and dry.     Findings: No rash.  Neurological:     Mental Status: He is alert.     Comments: Alert, speech clear. RUE rad/med/uln fxn intact, motor and sens.   Psychiatric:        Mood and Affect: Mood normal.     ED Results / Procedures / Treatments   Labs (all labs ordered are listed, but only abnormal results are displayed) Labs Reviewed - No data to display  EKG None  Radiology DG Shoulder Right  Result Date: 10/17/2019 CLINICAL DATA:  49 year old male with right greater than left shoulder pain. EXAM: RIGHT SHOULDER - 2+ VIEW COMPARISON:  None. FINDINGS: No glenohumeral joint dislocation. Proximal right humerus intact. A small 7 mm circumscribed sclerotic rim area projecting in the central proximal humerus has a benign appearance, perhaps a small enchondroma. The visible right clavicle and scapula appear intact. Joint spaces are preserved. There is minor degenerative spurring and sclerosis of the glenoid. Mild spurring also along the undersurface of the acromion. No acute osseous abnormality identified. Negative visible right ribs and chest. IMPRESSION: Mild osseous degeneration with no acute osseous abnormality identified about the left shoulder. Electronically Signed   By: 42 M.D.   On: 10/17/2019 08:36     Procedures Procedures (including critical care time)  Medications Ordered in ED Medications  acetaminophen (TYLENOL) tablet 1,000 mg (has no administration in time range)    ED Course  I have reviewed the triage vital signs and the nursing notes.  Pertinent labs & imaging results that were available during my care of the patient were reviewed by me and considered in my medical decision making (see chart for details).    MDM Rules/Calculators/A&P                         Imaging ordered.   Reviewed nursing notes and prior charts for additional history.  Pt took a bc powder early this AM, no other meds. Acetaminophen po.   Xrays reviewed/interpreted by me - degen changes, no fx.   Pt comfortable appearing, discussed xrays.   Pt appears stable for d/c.   Rx for home.      Final Clinical Impression(s) / ED Diagnoses Final diagnoses:  None    Rx / DC Orders ED Discharge Orders    None       Cathren Laine, MD 10/17/19 [id number]

## 2019-10-17 NOTE — Discharge Instructions (Signed)
It was our pleasure to provide your ER care today - we hope that you feel better.  Take prednisone as prescribed. You may also take ultram as need for pain - no driving when taking.   Rest shoulder, avoid heavy lifting or repetitive use of shoulder for the next 2-3 days.   Follow up with primary care doctor in 1-2 weeks if symptoms fail to improve/resolve.  Return to ER if worse, new symptoms, fevers, intractable pain, numbness/weakness, or other concern.

## 2019-10-19 ENCOUNTER — Telehealth: Payer: Self-pay | Admitting: Family

## 2019-10-19 NOTE — Telephone Encounter (Signed)
Needs appt with [facility]

## 2019-10-23 ENCOUNTER — Ambulatory Visit (INDEPENDENT_AMBULATORY_CARE_PROVIDER_SITE_OTHER): Payer: Managed Care, Other (non HMO) | Admitting: Family Medicine

## 2019-10-23 ENCOUNTER — Other Ambulatory Visit: Payer: Self-pay

## 2019-10-23 ENCOUNTER — Encounter: Payer: Self-pay | Admitting: Family Medicine

## 2019-10-23 VITALS — BP 151/99 | HR 113 | Temp 97.3°F | Ht 68.0 in | Wt 143.8 lb

## 2019-10-23 DIAGNOSIS — M25511 Pain in right shoulder: Secondary | ICD-10-CM | POA: Diagnosis not present

## 2019-10-23 DIAGNOSIS — Z09 Encounter for follow-up examination after completed treatment for conditions other than malignant neoplasm: Secondary | ICD-10-CM

## 2019-10-23 MED ORDER — DICLOFENAC SODIUM 75 MG PO TBEC: 75.0000 mg | DELAYED_RELEASE_TABLET | Freq: Two times a day (BID) | ORAL | 3 refills | Status: DC

## 2019-10-23 NOTE — Progress Notes (Signed)
Subjective: CC: ER follow up, right shoulder pain PCP: Sharion Balloon, FNP  PXT:GGYIR H Krotzer is a 49 y.o. male presenting to clinic today for:  1. Right shoulder pain  Loyed woke up 1 week ago with right shoulder pain. He describes the pain as burning with numbness and tingling down his right arm at times. Pain is moderate to severe. He was seen in the ER on 9/8 for the pain and had a Xray done. They prescribed tramadol and a prednisone taper. He finished the prednisone taper but reports it did not help much. The tramadol provides some relief for a few hours. He has also tried icy hot with some relief. Reports weakness and limited ROM in his right shoulder and arm. Denies loss of grip strength. Denies chest pain, shortness of breath, dizziness. Denies known trauma to the right shoulder. He reports a hx of bilateral rotator cuff injurgies when he was in his 31s that he did not have treated.   Relevant past medical, surgical, family, and social history reviewed and updated as indicated.  Allergies and medications reviewed and updated.  Allergies  Allergen Reactions  . Penicillins Nausea And Vomiting   Past Medical History:  Diagnosis Date  . Hypertension     Current Outpatient Medications:  .  amLODipine (NORVASC) 5 MG tablet, Take 1 tablet (5 mg total) by mouth daily., Disp: 90 tablet, Rfl: 4 .  atorvastatin (LIPITOR) 20 MG tablet, Take 1 tablet (20 mg total) by mouth daily., Disp: 90 tablet, Rfl: 3 .  ibuprofen (ADVIL) 800 MG tablet, Take 1 tablet (800 mg total) by mouth every 8 (eight) hours as needed., Disp: 30 tablet, Rfl: 0 .  meclizine (ANTIVERT) 25 MG tablet, Take 1 tablet (25 mg total) by mouth 3 (three) times daily as needed., Disp: 90 tablet, Rfl: 4 .  predniSONE (DELTASONE) 20 MG tablet, 3 po once a day for 2 days, then 2 po once a day for 2 days, then 1 po once a day for 2 days, Disp: 12 tablet, Rfl: 0 .  traMADol (ULTRAM) 50 MG tablet, Take 1 tablet (50 mg total) by  mouth every 6 (six) hours as needed., Disp: 15 tablet, Rfl: 0 .  diclofenac (VOLTAREN) 75 MG EC tablet, Take 1 tablet (75 mg total) by mouth 2 (two) times daily. (Patient not taking: Reported on 10/23/2019), Disp: 60 tablet, Rfl: 3 Social History   Socioeconomic History  . Marital status: Divorced    Spouse name: Not on file  . Number of children: Not on file  . Years of education: Not on file  . Highest education level: Not on file  Occupational History  . Not on file  Tobacco Use  . Smoking status: Current Every Day Smoker    Packs/day: 1.00    Types: Cigarettes  . Smokeless tobacco: Current User    Types: Snuff  Vaping Use  . Vaping Use: Never used  Substance and Sexual Activity  . Alcohol use: No  . Drug use: No  . Sexual activity: Not on file  Other Topics Concern  . Not on file  Social History Narrative  . Not on file   Social Determinants of Health   Financial Resource Strain:   . Difficulty of Paying Living Expenses: Not on file  Food Insecurity:   . Worried About Charity fundraiser in the Last Year: Not on file  . Ran Out of Food in the Last Year: Not on file  Transportation Needs:   .  Lack of Transportation (Medical): Not on file  . Lack of Transportation (Non-Medical): Not on file  Physical Activity:   . Days of Exercise per Week: Not on file  . Minutes of Exercise per Session: Not on file  Stress:   . Feeling of Stress : Not on file  Social Connections:   . Frequency of Communication with Friends and Family: Not on file  . Frequency of Social Gatherings with Friends and Family: Not on file  . Attends Religious Services: Not on file  . Active Member of Clubs or Organizations: Not on file  . Attends Archivist Meetings: Not on file  . Marital Status: Not on file  Intimate Partner Violence:   . Fear of Current or Ex-Partner: Not on file  . Emotionally Abused: Not on file  . Physically Abused: Not on file  . Sexually Abused: Not on file    Family History  Problem Relation Age of Onset  . Cancer Mother        stomach  . Heart disease Father        Heart attack    Review of Systems  Constitutional: Negative for chills and fever.  Musculoskeletal: Negative for neck pain and neck stiffness.  Neurological: Negative for facial asymmetry.   see HPI.   Objective: Office vital signs reviewed. BP (!) 151/99   Pulse (!) 113   Temp (!) 97.3 F (36.3 C) (Temporal)   Ht _0  (1.727 m)   Wt 143 lb 12.8 oz (65.2 kg)   SpO2 99%   BMI 21.86 kg/m   Physical Examination:  Physical Exam Vitals and nursing note reviewed.  Constitutional:      General: He is not in acute distress.    Appearance: Normal appearance. He is not ill-appearing or diaphoretic.  HENT:     Head: Normocephalic and atraumatic.  Cardiovascular:     Rate and Rhythm: Normal rate and regular rhythm.     Heart sounds: Normal heart sounds. No murmur heard.   Pulmonary:     Effort: No respiratory distress.     Breath sounds: Normal breath sounds.  Musculoskeletal:     Right shoulder: No swelling, deformity, effusion, tenderness or bony tenderness. Decreased range of motion. Decreased strength.     Left shoulder: Normal.     Cervical back: Normal range of motion and neck supple. No rigidity or tenderness.     Comments: Decreased strength and ROM in right shoulder compared to left. Positive empty can test. Patient unwilling to attempt internal rotation or lag test. No erythema or warmth of joint.   Skin:    General: Skin is warm and dry.     Findings: No erythema.  Neurological:     General: No focal deficit present.     Mental Status: He is alert and oriented to person, place, and time.  Psychiatric:        Mood and Affect: Mood normal.        Behavior: Behavior normal.      Results for orders placed or performed in visit on 07/24/19  CMP14+EGFR  Result Value Ref Range   Glucose 93 65 - 99 mg/dL   BUN 10 6 - 24 mg/dL   Creatinine, Ser 0.83  0.76 - 1.27 mg/dL   GFR calc non Af Amer 103 >59 mL/min/1.73   GFR calc Af Amer 119 >59 mL/min/1.73   BUN/Creatinine Ratio 12 9 - 20   Sodium 141 134 - 144 mmol/L  Potassium 4.5 3.5 - 5.2 mmol/L   Chloride 104 96 - 106 mmol/L   CO2 23 20 - 29 mmol/L   Calcium 8.9 8.7 - 10.2 mg/dL   Total Protein 7.0 6.0 - 8.5 g/dL   Albumin 4.3 4.0 - 5.0 g/dL   Globulin, Total 2.7 1.5 - 4.5 g/dL   Albumin/Globulin Ratio 1.6 1.2 - 2.2   Bilirubin Total 0.2 0.0 - 1.2 mg/dL   Alkaline Phosphatase 62 48 - 121 IU/L   AST 42 (H) 0 - 40 IU/L   ALT 26 0 - 44 IU/L  CBC with Differential/Platelet  Result Value Ref Range   WBC 5.1 3.4 - 10.8 x10E3/uL   RBC 4.71 4.14 - 5.80 x10E6/uL   Hemoglobin 9.5 (L) 13.0 - 17.7 g/dL   Hematocrit 31.0 (L) 37.5 - 51.0 %   MCV 66 (L) 79 - 97 fL   MCH 20.2 (L) 26.6 - 33.0 pg   MCHC 30.6 (L) 31 - 35 g/dL   RDW 17.0 (H) 11.6 - 15.4 %   Platelets 303 150 - 450 x10E3/uL   Neutrophils 48 Not Estab. %   Lymphs 32 Not Estab. %   Monocytes 15 Not Estab. %   Eos 2 Not Estab. %   Basos 3 Not Estab. %   Neutrophils Absolute 2.5 1 - 7 x10E3/uL   Lymphocytes Absolute 1.6 0 - 3 x10E3/uL   Monocytes Absolute 0.8 0 - 0 x10E3/uL   EOS (ABSOLUTE) 0.1 0.0 - 0.4 x10E3/uL   Basophils Absolute 0.2 0 - 0 x10E3/uL   Immature Granulocytes 0 Not Estab. %   Immature Grans (Abs) 0.0 0.0 - 0.1 x10E3/uL  Lipid panel  Result Value Ref Range   Cholesterol, Total 206 (H) 100 - 199 mg/dL   Triglycerides 158 (H) 0 - 149 mg/dL   HDL 76 >39 mg/dL   VLDL Cholesterol Cal 27 5 - 40 mg/dL   LDL Chol Calc (NIH) 103 (H) 0 - 99 mg/dL   Chol/HDL Ratio 2.7 0.0 - 5.0 ratio  PSA, total and free  Result Value Ref Range   Prostate Specific Ag, Serum 4.5 (H) 0.0 - 4.0 ng/mL   PSA, Free 1.19 N/A ng/mL   PSA, Free Pct 26.4 %  TSH  Result Value Ref Range   TSH 0.568 0.450 - 4.500 uIU/mL  Hepatitis C antibody  Result Value Ref Range   Hep C Virus Ab <0.1 0.0 - 0.9 s/co ratio     Assessment/ Plan: Ibrahim  was seen today for shoulder pain and follow-up.  Diagnoses and all orders for this visit:  Hospital discharge follow-up Note reviewed from ER visit on 9/8. Right shoulder Xray showed mild osseous degeneration with no acute abnormality.   Acute pain of right shoulder Referral to orthopedic surgery given positive empty can test, pain and weakness with ROM for further assessment for possible rotator cuff injury. Rest, heat, continue tramadol. Voltaren ordered. Follow up if symptoms worsen.   -     Ambulatory referral to Orthopedic Surgery -     diclofenac (VOLTAREN) 75 MG EC tablet; Take 1 tablet (75 mg total) by mouth 2 (two) times daily.    The above assessment and management plan was discussed with the patient. The patient verbalized understanding of and has agreed to the management plan. Patient is aware to call the clinic if symptoms persist or worsen. Patient is aware when to return to the clinic for a follow-up visit. Patient educated on when it is appropriate to  go to the emergency department.   Marjorie Smolder, FNP-C [facility] [address] [place], [place] [address] [phone]

## 2019-10-23 NOTE — Patient Instructions (Signed)
Shoulder Pain Many things can cause shoulder pain, including:  An injury to the shoulder.  Overuse of the shoulder.  Arthritis. The source of the pain can be:  Inflammation.  An injury to the shoulder joint.  An injury to a tendon, ligament, or bone. Follow these instructions at home: Pay attention to changes in your symptoms. Let your health care provider know about them. Follow these instructions to relieve your pain. If you have a sling:  Wear the sling as told by your health care provider. Remove it only as told by your health care provider.  Loosen the sling if your fingers tingle, become numb, or turn cold and blue.  Keep the sling clean.  If the sling is not waterproof: ? Do not let it get wet. Remove it to shower or bathe.  Move your arm as little as possible, but keep your hand moving to prevent swelling. Managing pain, stiffness, and swelling   If directed, put ice on the painful area: ? Put ice in a plastic bag. ? Place a towel between your skin and the bag. ? Leave the ice on for 20 minutes, 2-3 times per day. Stop applying ice if it does not help with the pain.  Squeeze a soft ball or a foam pad as much as possible. This helps to keep the shoulder from swelling. It also helps to strengthen the arm. General instructions  Take over-the-counter and prescription medicines only as told by your health care provider.  Keep all follow-up visits as told by your health care provider. This is important. Contact a health care provider if:  Your pain gets worse.  Your pain is not relieved with medicines.  New pain develops in your arm, hand, or fingers. Get help right away if:  Your arm, hand, or fingers: ? Tingle. ? Become numb. ? Become swollen. ? Become painful. ? Turn white or blue. Summary  Shoulder pain can be caused by an injury, overuse, or arthritis.  Pay attention to changes in your symptoms. Let your health care provider know about  them.  This condition may be treated with a sling, ice, and pain medicines.  Contact your health care provider if the pain gets worse or new pain develops. Get help right away if your arm, hand, or fingers tingle or become numb, swollen, or painful.  Keep all follow-up visits as told by your health care provider. This is important. This information is not intended to replace advice given to you by your health care provider. Make sure you discuss any questions you have with your health care provider. Document Revised: 08/09/2017 Document Reviewed: 08/09/2017 Elsevier Patient Education  2020 [employer]  

## 2019-10-31 ENCOUNTER — Telehealth: Payer: Self-pay | Admitting: Family

## 2019-10-31 NOTE — Telephone Encounter (Signed)
We dont have any acute opening until 27th? It depends on how he feels. He could work if he wanted, or if he would rather he could stay out. If any dizziness or weakness he needs to go to ED.

## 2019-11-05 ENCOUNTER — Ambulatory Visit (INDEPENDENT_AMBULATORY_CARE_PROVIDER_SITE_OTHER): Payer: Managed Care, Other (non HMO) | Admitting: Family Medicine

## 2019-11-05 ENCOUNTER — Telehealth: Payer: Self-pay | Admitting: Family Medicine

## 2019-11-05 ENCOUNTER — Telehealth: Payer: Self-pay | Admitting: *Deleted

## 2019-11-05 ENCOUNTER — Encounter: Payer: Self-pay | Admitting: Family Medicine

## 2019-11-05 ENCOUNTER — Other Ambulatory Visit: Payer: Self-pay

## 2019-11-05 VITALS — BP 139/89 | HR 94 | Temp 97.9°F | Ht 68.0 in | Wt 146.0 lb

## 2019-11-05 DIAGNOSIS — K921 Melena: Secondary | ICD-10-CM

## 2019-11-05 DIAGNOSIS — D649 Anemia, unspecified: Secondary | ICD-10-CM

## 2019-11-05 DIAGNOSIS — R1013 Epigastric pain: Secondary | ICD-10-CM | POA: Diagnosis not present

## 2019-11-05 DIAGNOSIS — Z1211 Encounter for screening for malignant neoplasm of colon: Secondary | ICD-10-CM | POA: Diagnosis not present

## 2019-11-05 LAB — HEMOGLOBIN, FINGERSTICK: Hemoglobin: 10.2 g/dL — ABNORMAL LOW (ref 12.6–17.7)

## 2019-11-05 MED ORDER — OMEPRAZOLE 20 MG PO CPDR: 20.0000 mg | DELAYED_RELEASE_CAPSULE | Freq: Every day | ORAL | 3 refills | Status: DC

## 2019-11-05 NOTE — Patient Instructions (Signed)
Hemorrhoids Hemorrhoids are swollen veins in and around the rectum or anus. There are two types of hemorrhoids:  Internal hemorrhoids. These occur in the veins that are just inside the rectum. They may poke through to the outside and become irritated and painful.  External hemorrhoids. These occur in the veins that are outside the anus and can be felt as a painful swelling or hard lump near the anus. Most hemorrhoids do not cause serious problems, and they can be managed with home treatments such as diet and lifestyle changes. If home treatments do not help the symptoms, procedures can be done to shrink or remove the hemorrhoids. What are the causes? This condition is caused by increased pressure in the anal area. This pressure may result from various things, including:  Constipation.  Straining to have a bowel movement.  Diarrhea.  Pregnancy.  Obesity.  Sitting for long periods of time.  Heavy lifting or other activity that causes you to strain.  Anal sex.  Riding a bike for a long period of time. What are the signs or symptoms? Symptoms of this condition include:  Pain.  Anal itching or irritation.  Rectal bleeding.  Leakage of stool (feces).  Anal swelling.  One or more lumps around the anus. How is this diagnosed? This condition can often be diagnosed through a visual exam. Other exams or tests may also be done, such as:  An exam that involves feeling the rectal area with a gloved hand (digital rectal exam).  An exam of the anal canal that is done using a small tube (anoscope).  A blood test, if you have lost a significant amount of blood.  A test to look inside the colon using a flexible tube with a camera on the end (sigmoidoscopy or colonoscopy). How is this treated? This condition can usually be treated at home. However, various procedures may be done if dietary changes, lifestyle changes, and other home treatments do not help your symptoms. These  procedures can help make the hemorrhoids smaller or remove them completely. Some of these procedures involve surgery, and others do not. Common procedures include:  Rubber band ligation. Rubber bands are placed at the base of the hemorrhoids to cut off their blood supply.  Sclerotherapy. Medicine is injected into the hemorrhoids to shrink them.  Infrared coagulation. A type of light energy is used to get rid of the hemorrhoids.  Hemorrhoidectomy surgery. The hemorrhoids are surgically removed, and the veins that supply them are tied off.  Stapled hemorrhoidopexy surgery. The surgeon staples the base of the hemorrhoid to the rectal wall. Follow these instructions at home: Eating and drinking   Eat foods that have a lot of fiber in them, such as whole grains, beans, nuts, fruits, and vegetables.  Ask your health care provider about taking products that have added fiber (fiber supplements).  Reduce the amount of fat in your diet. You can do this by eating low-fat dairy products, eating less red meat, and avoiding processed foods.  Drink enough fluid to keep your urine pale yellow. Managing pain and swelling   Take warm sitz baths for 20 minutes, 3-4 times a day to ease pain and discomfort. You may do this in a bathtub or using a portable sitz bath that fits over the toilet.  If directed, apply ice to the affected area. Using ice packs between sitz baths may be helpful. ? Put ice in a plastic bag. ? Place a towel between your skin and the bag. ? Leave  the ice on for 20 minutes, 2-3 times a day. General instructions  Take over-the-counter and prescription medicines only as told by your health care provider.  Use medicated creams or suppositories as told.  Get regular exercise. Ask your health care provider how much and what kind of exercise is best for you. In general, you should do moderate exercise for at least 30 minutes on most days of the week (150 minutes each week). This can  include activities such as walking, biking, or yoga.  Go to the bathroom when you have the urge to have a bowel movement. Do not wait.  Avoid straining to have bowel movements.  Keep the anal area dry and clean. Use wet toilet paper or moist towelettes after a bowel movement.  Do not sit on the toilet for long periods of time. This increases blood pooling and pain.  Keep all follow-up visits as told by your health care provider. This is important. Contact a health care provider if you have:  Increasing pain and swelling that are not controlled by treatment or medicine.  Difficulty having a bowel movement, or you are unable to have a bowel movement.  Pain or inflammation outside the area of the hemorrhoids. Get help right away if you have:  Uncontrolled bleeding from your rectum. Summary  Hemorrhoids are swollen veins in and around the rectum or anus.  Most hemorrhoids can be managed with home treatments such as diet and lifestyle changes.  Taking warm sitz baths can help ease pain and discomfort.  In severe cases, procedures or surgery can be done to shrink or remove the hemorrhoids. This information is not intended to replace advice given to you by your health care provider. Make sure you discuss any questions you have with your health care provider. Document Revised: 06/23/2018 Document Reviewed: 06/16/2017 Elsevier Patient Education  2020 [employer] Bloody Diarrhea Bloody diarrhea is frequent loose and watery bowel movements that contain blood. The blood can be hard to see or notice (occult). Bloody diarrhea may be caused by medical conditions such as:  Ulcerative colitis.  Crohn's disease.  Intestinal infection.  Viral gastroenteritis or bacterial gastroenteritis. Finding out why there is blood in your diarrhea is necessary so that your health care provider can prescribe the right treatment for you. Follow the instructions from your health care provider about  treating the cause of your bloody diarrhea. Any type of diarrhea can make you feel weak and dehydrated. Dehydration can make you tired and thirsty, cause you to have a dry mouth, and decrease how often you urinate. Follow these instructions at home: Eating and drinking     Follow these recommendations as told by your health care provider:  Take an oral rehydration solution (ORS). This is an over-the-counter medicine that helps return your body to its normal balance of nutrients and water. It is found at pharmacies and retail stores.  Drink enough fluid to keep your urine pale yellow. ? Drink fluids such as water, ice chips, diluted fruit juice, and low-calorie sports drinks. You can also drink milk products, if desired. ? Avoid drinking fluids that contain a lot of sugar or caffeine, such as energy drinks, regular sports drinks, and soda. ? Avoid alcohol.  Eat bland, easy-to-digest foods in small amounts as you are able. These foods include bananas, applesauce, rice, lean meats, toast, and crackers.  Avoid spicy or fatty foods.  Medicines  Take over-the-counter and prescription medicines only as told by your health care provider. ? Your  health care provider may prescribe medicine to slow down the frequency of diarrhea or to ease stomach discomfort.  If you were prescribed an antibiotic medicine, take it as told by your health care provider. Do not stop using the antibiotic even if you start to feel better. General instructions   Wash your hands often using soap and water. If soap and water are not available, use a hand sanitizer. Others in the household should wash their hands as well. Hands should be washed: ? After using the toilet or changing a diaper. ? Before preparing, cooking, or serving food. ? While caring for a sick person or while visiting someone in a hospital.  Rest at home while you recover.  Take a warm bath to relieve any burning or pain from frequent diarrhea  episodes.  Watch your condition for any changes.  Keep all follow-up visits as told by your health care provider. This is important. Contact a health care provider if:  You have a fever.  Your diarrhea gets worse.  You have new symptoms.  You cannot keep fluids down.  You feel light-headed or dizzy.  You have a headache.  You have muscle cramps. Get help right away if:  You have chest pain.  You feel extremely weak or you faint.  The blood in your diarrhea increases or turns a different color.  You vomit and the vomit is bloody or looks black.  You have persistent diarrhea.  You have severe pain, cramping, or bloating in your abdomen.  You have trouble breathing or you are breathing very quickly.  Your heart is beating very quickly.  Your skin feels cold and clammy.  You feel confused.  You have signs of dehydration, such as: ? Dark urine, very little urine, or no urine. ? Cracked lips. ? Dry mouth. ? Sunken eyes. ? Sleepiness. ? Weakness. Summary  Bloody diarrhea is frequent loose and watery bowel movements that contain blood. The blood can be hard to see or notice (occult).  Follow the instructions from your health care provider about treating the cause of your bloody diarrhea.  Any type of diarrhea can make you feel weak and dehydrated.  Follow your health care provider's recommendations for eating and drinking and for taking medicines.  Contact your health care provider if your symptoms get worse. Get help right away if you have signs of dehydration. This information is not intended to replace advice given to you by your health care provider. Make sure you discuss any questions you have with your health care provider. Document Revised: 07/07/2017 Document Reviewed: 07/07/2017 Elsevier Patient Education  2020 [employer].

## 2019-11-05 NOTE — Telephone Encounter (Signed)
Pt's hgb level was 10.2 in office and Tiffany wanted pt to start Iron Supplement otc. Pt advised and voiced understanding.

## 2019-11-05 NOTE — Progress Notes (Signed)
Subjective: CC: blood in stool PCP: Sharion Balloon, FNP  Casey Yang is a 49 y.o. male presenting to clinic today for:  1. Blood in stool Casey Yang reports bright red blood in stool x 4 that occurred 1 week ago. It has not occurred since. He denies straining, constipation, or black tarry stools. Denies hemorrhoids or rectal pain. He reports occasional diarrhea, depending on what he eats. He has noticed epigastric stomach pain that feels like a burning sensation for the last few weeks. He reports this started after beginning antiinflammatories for his shoulder pain. He does take this medication with food. He has not taken anything for relief of stomach pain. Denies fever, nausea, vomiting, weight loss, weakness, dizziness, or lightheadedness. He has never had a colonoscopy before.   Relevant past medical, surgical, family, and social history reviewed and updated as indicated.  Allergies and medications reviewed and updated.  Allergies  Allergen Reactions  . Penicillins Nausea And Vomiting   Past Medical History:  Diagnosis Date  . Hypertension     Current Outpatient Medications:  .  amLODipine (NORVASC) 5 MG tablet, Take 1 tablet (5 mg total) by mouth daily., Disp: 90 tablet, Rfl: 4 .  atorvastatin (LIPITOR) 20 MG tablet, Take 1 tablet (20 mg total) by mouth daily., Disp: 90 tablet, Rfl: 3 .  diclofenac (VOLTAREN) 75 MG EC tablet, Take 1 tablet (75 mg total) by mouth 2 (two) times daily., Disp: 60 tablet, Rfl: 3 .  ibuprofen (ADVIL) 800 MG tablet, Take 1 tablet (800 mg total) by mouth every 8 (eight) hours as needed., Disp: 30 tablet, Rfl: 0 .  meclizine (ANTIVERT) 25 MG tablet, Take 1 tablet (25 mg total) by mouth 3 (three) times daily as needed., Disp: 90 tablet, Rfl: 4 .  traMADol (ULTRAM) 50 MG tablet, Take 1 tablet (50 mg total) by mouth every 6 (six) hours as needed., Disp: 15 tablet, Rfl: 0 Social History   Socioeconomic History  . Marital status: Divorced    Spouse  name: Not on file  . Number of children: Not on file  . Years of education: Not on file  . Highest education level: Not on file  Occupational History  . Not on file  Tobacco Use  . Smoking status: Current Every Day Smoker    Packs/day: 1.00    Types: Cigarettes  . Smokeless tobacco: Current User    Types: Snuff  Vaping Use  . Vaping Use: Never used  Substance and Sexual Activity  . Alcohol use: No  . Drug use: No  . Sexual activity: Not on file  Other Topics Concern  . Not on file  Social History Narrative  . Not on file   Social Determinants of Health   Financial Resource Strain:   . Difficulty of Paying Living Expenses: Not on file  Food Insecurity:   . Worried About Charity fundraiser in the Last Year: Not on file  . Ran Out of Food in the Last Year: Not on file  Transportation Needs:   . Lack of Transportation (Medical): Not on file  . Lack of Transportation (Non-Medical): Not on file  Physical Activity:   . Days of Exercise per Week: Not on file  . Minutes of Exercise per Session: Not on file  Stress:   . Feeling of Stress : Not on file  Social Connections:   . Frequency of Communication with Friends and Family: Not on file  . Frequency of Social Gatherings with Friends and  Family: Not on file  . Attends Religious Services: Not on file  . Active Member of Clubs or Organizations: Not on file  . Attends Banker Meetings: Not on file  . Marital Status: Not on file  Intimate Partner Violence:   . Fear of Current or Ex-Partner: Not on file  . Emotionally Abused: Not on file  . Physically Abused: Not on file  . Sexually Abused: Not on file   Family History  Problem Relation Age of Onset  . Cancer Mother        stomach  . Heart disease Father        Heart attack    Review of Systems  Per HPI.   Objective: Office vital signs reviewed. BP 139/89   Pulse 94   Temp 97.9 F (36.6 C) (Temporal)   Ht 5\' 8"  (1.727 m)   Wt 146 lb (66.2 kg)    BMI 22.20 kg/m   Physical Examination:  Physical Exam Vitals and nursing note reviewed.  Constitutional:      General: He is not in acute distress.    Appearance: Normal appearance. He is normal weight. He is not ill-appearing.  Cardiovascular:     Rate and Rhythm: Normal rate and regular rhythm.     Heart sounds: Normal heart sounds. No murmur heard.   Pulmonary:     Effort: Pulmonary effort is normal.     Breath sounds: Normal breath sounds.  Abdominal:     General: Abdomen is flat. Bowel sounds are normal. There is no distension.     Palpations: Abdomen is soft. There is no mass.     Tenderness: There is no abdominal tenderness. There is no guarding or rebound.     Hernia: No hernia is present.  Genitourinary:    Rectum: No tenderness, anal fissure or external hemorrhoid.  Skin:    General: Skin is warm and dry.  Neurological:     General: No focal deficit present.     Mental Status: He is alert and oriented to person, place, and time.     Motor: No weakness.  Psychiatric:        Mood and Affect: Mood normal.        Behavior: Behavior normal.      Results for orders placed or performed in visit on 07/24/19  CMP14+EGFR  Result Value Ref Range   Glucose 93 65 - 99 mg/dL   BUN 10 6 - 24 mg/dL   Creatinine, Ser 07/26/19 0.76 - 1.27 mg/dL   GFR calc non Af Amer 103 >59 mL/min/1.73   GFR calc Af Amer 119 >59 mL/min/1.73   BUN/Creatinine Ratio 12 9 - 20   Sodium 141 134 - 144 mmol/L   Potassium 4.5 3.5 - 5.2 mmol/L   Chloride 104 96 - 106 mmol/L   CO2 23 20 - 29 mmol/L   Calcium 8.9 8.7 - 10.2 mg/dL   Total Protein 7.0 6.0 - 8.5 g/dL   Albumin 4.3 4.0 - 5.0 g/dL   Globulin, Total 2.7 1.5 - 4.5 g/dL   Albumin/Globulin Ratio 1.6 1.2 - 2.2   Bilirubin Total 0.2 0.0 - 1.2 mg/dL   Alkaline Phosphatase 62 48 - 121 IU/L   AST 42 (H) 0 - 40 IU/L   ALT 26 0 - 44 IU/L  CBC with Differential/Platelet  Result Value Ref Range   WBC 5.1 3.4 - 10.8 x10E3/uL   RBC 4.71 4.14 - 5.80  x10E6/uL   Hemoglobin 9.5 (L)  13.0 - 17.7 g/dL   Hematocrit 31.0 (L) 37.5 - 51.0 %   MCV 66 (L) 79 - 97 fL   MCH 20.2 (L) 26.6 - 33.0 pg   MCHC 30.6 (L) 31 - 35 g/dL   RDW 17.0 (H) 11.6 - 15.4 %   Platelets 303 150 - 450 x10E3/uL   Neutrophils 48 Not Estab. %   Lymphs 32 Not Estab. %   Monocytes 15 Not Estab. %   Eos 2 Not Estab. %   Basos 3 Not Estab. %   Neutrophils Absolute 2.5 1 - 7 x10E3/uL   Lymphocytes Absolute 1.6 0 - 3 x10E3/uL   Monocytes Absolute 0.8 0 - 0 x10E3/uL   EOS (ABSOLUTE) 0.1 0.0 - 0.4 x10E3/uL   Basophils Absolute 0.2 0 - 0 x10E3/uL   Immature Granulocytes 0 Not Estab. %   Immature Grans (Abs) 0.0 0.0 - 0.1 x10E3/uL  Lipid panel  Result Value Ref Range   Cholesterol, Total 206 (H) 100 - 199 mg/dL   Triglycerides 158 (H) 0 - 149 mg/dL   HDL 76 >39 mg/dL   VLDL Cholesterol Cal 27 5 - 40 mg/dL   LDL Chol Calc (NIH) 103 (H) 0 - 99 mg/dL   Chol/HDL Ratio 2.7 0.0 - 5.0 ratio  PSA, total and free  Result Value Ref Range   Prostate Specific Ag, Serum 4.5 (H) 0.0 - 4.0 ng/mL   PSA, Free 1.19 N/A ng/mL   PSA, Free Pct 26.4 %  TSH  Result Value Ref Range   TSH 0.568 0.450 - 4.500 uIU/mL  Hepatitis C antibody  Result Value Ref Range   Hep C Virus Ab <0.1 0.0 - 0.9 s/co ratio     Assessment/ Plan: [place] was seen today for blood in stools.  Diagnoses and all orders for this visit:  Blood in stool, frank Fingerstick hemoglobin is 10.2 today, with patient's age and blood in stool, patient needs colonoscopy for further assessment due to concern for GI bleed. RTO for new or worsening symptoms. Patient aware of when to go to ED.  -     Hemoglobin, fingerstick -     Ambulatory referral to Gastroenterology  Epigastric pain Pain description consistent with GERD. Discussed starting Prilosec. Also discussed taking tylenol rather than antiinflammatory for shoulder pain.  -     omeprazole (PRILOSEC) 20 MG capsule; Take 1 capsule (20 mg total) by mouth daily.  Colon  cancer screening -     Ambulatory referral to Gastroenterology  Low hemoglobin Fingerstick hemoglobin 10.2 today in office, which is higher than previous hgb of 9.5 on 07/24/19. Start iron supplement for now.  -     Ambulatory referral to Gastroenterology  The above assessment and management plan was discussed with the patient. The patient verbalized understanding of and has agreed to the management plan. Patient is aware to call the clinic if symptoms persist or worsen. Patient is aware when to return to the clinic for a follow-up visit. Patient educated on when it is appropriate to go to the emergency department.   Marjorie Smolder, FNP-C [facility] [address] [place], [place] [address] [phone]

## 2019-11-08 ENCOUNTER — Encounter: Payer: Self-pay | Admitting: *Deleted

## 2019-11-08 ENCOUNTER — Ambulatory Visit (INDEPENDENT_AMBULATORY_CARE_PROVIDER_SITE_OTHER): Payer: Self-pay | Admitting: *Deleted

## 2019-11-08 ENCOUNTER — Other Ambulatory Visit: Payer: Self-pay

## 2019-11-08 VITALS — Ht 68.0 in | Wt 145.0 lb

## 2019-11-08 DIAGNOSIS — Z1211 Encounter for screening for malignant neoplasm of colon: Secondary | ICD-10-CM

## 2019-11-08 NOTE — Progress Notes (Addendum)
Gastroenterology Pre-Procedure Review  Request Date: 11/08/2019 Requesting Physician: Jannifer Rodney, NP @ [facility], no previous TCS, possible family hx colon cancer (father)  PATIENT REVIEW QUESTIONS: The patient responded to the following health history questions as indicated:    1. Diabetes Melitis: no 2. Joint replacements in the past 12 months: no 3. Major health problems in the past 3 months: no 4. Has an artificial valve or MVP: no 5. Has a defibrillator: no 6. Has been advised in past to take antibiotics in advance of a procedure like teeth cleaning: no 7. Family history of colon cancer: yes, thinks dad might have had colon cancer: age 22  8. Alcohol Use: yes, 2 beers on weekends 9. Illicit drug Use: no 10. History of sleep apnea: no  11. History of coronary artery or other vascular stents placed within the last 12 months: no 12. History of any prior anesthesia complications: no 13. Body mass index is 22.05 kg/m.    MEDICATIONS & ALLERGIES:    Patient reports the following regarding taking any blood thinners:   Plavix? no Aspirin? no Coumadin? no Brilinta? no Xarelto? no Eliquis? no Pradaxa? no Savaysa? no Effient? no  Patient confirms/reports the following medications:  Current Outpatient Medications  Medication Sig Dispense Refill  . amLODipine (NORVASC) 5 MG tablet Take 1 tablet (5 mg total) by mouth daily. 90 tablet 4  . atorvastatin (LIPITOR) 20 MG tablet Take 1 tablet (20 mg total) by mouth daily. 90 tablet 3  . diclofenac (VOLTAREN) 75 MG EC tablet Take 1 tablet (75 mg total) by mouth 2 (two) times daily. 60 tablet 3  . Ferrous Sulfate (IRON PO) Take by mouth daily.    Marland Kitchen ibuprofen (ADVIL) 800 MG tablet Take 1 tablet (800 mg total) by mouth every 8 (eight) hours as needed. (Patient taking differently: Take 800 mg by mouth as needed. ) 30 tablet 0  . meclizine (ANTIVERT) 25 MG tablet Take 1 tablet (25 mg total) by mouth 3 (three) times daily as needed. (Patient taking  differently: Take 25 mg by mouth as needed. ) 90 tablet 4  . omeprazole (PRILOSEC) 20 MG capsule Take 1 capsule (20 mg total) by mouth daily. 30 capsule 3   No current facility-administered medications for this visit.    Patient confirms/reports the following allergies:  Allergies  Allergen Reactions  . Penicillins Nausea And Vomiting    No orders of the defined types were placed in this encounter.   AUTHORIZATION INFORMATION Primary Insurance: [place],  [place] #: [id number],  Group #: [id number] Pre-Cert / Berkley Harvey required: No, not required Pre-Cert / Auth #:  Ref#: [id number]  SCHEDULE INFORMATION: Procedure has been scheduled as follows:  Date: 11/23/2019, Time: 10:00 Location: [facility] with Dr. Marletta Lor  This Gastroenterology Pre-Precedure Review Form is being routed to the following provider(s): Lewie Loron, NP

## 2019-11-08 NOTE — Telephone Encounter (Signed)
erro  neous encounter

## 2019-11-08 NOTE — Patient Instructions (Signed)
[place]   Please notify us immediately if you are diabetic, take iron supplements, or if you are on coumadin or any blood thinners.   Patient Name: Casey Yang Date of procedure: 11/23/2019 Time to register at [facility]: 8:30 am Provider: Dr. Abbey Chatters   Purchase: MIRALAX 238 gram bottle, 1 FLEET ENEMA, 1 box of DULCOLAX (All over the counter medications)    11/21/2019- 2 Days prior to procedure: START CLEAR LIQUID DIET AFTER YOUR LUNCH MEAL--NO SOLID FOODS!   11/22/2019- 1 Day prior to procedure:   CLEAR LIQUIDS ALL DAY--NO SOLID FOODS!   Diabetic medication adjustments for today:    At 10:00 AM, take 2 DULCOLAX $RemoveBe'5mg'hGHPozdyh$  tablets   At 12:00 PM, Mix 5 teaspoons of Miralax in any 4-6 ounces of CLEAR LIQUIDS (Gatorade) every hour for 5 hours until passing clear, watery stools. Be sure to drink 4 ounces of clear liquid 30 minutes after each dose of Miralax.   At 3:00 PM, take 2 Dulcolax $RemoveBe'5mg'LeCGWBtRi$  tablets   If stools are not clear & watery by 6:00 PM, take 5 teaspoons of Miralax every 30 minutes until stools are clear (no color)   You must have a complete prep to ensure the most effective cleaning.   CONTINUE CLEAR LIQUIDS ONLY UNTIL MIDNIGHT. Make a conscious effort to drink as much as you can before, during & after the preparation.    NOTHING TO EAT OR DRINK AFTER MIDNIGHT except for your heart, blood pressure & breathing medications. You may take them with a sip of clear liquids.   11/23/2019-- Day of Procedure  Give yourself one Fleet enema about 1 hour prior to leaving for the hospital.   Diabetic medication adjustments for today:   You may take TYLENOL products. Please continue your regular medications unless we have instructed you otherwise.    Please note, on the day of your procedure you MUST be accompanied by an adult who is willing to assume responsibility for you at time of discharge. If you do not have such person with you, your  procedure will have to be rescheduled.                                                             Please leave ALL jewelry at home prior to coming to the hospital for your procedure.   *It is your responsibility to check with your insurance company for the benefits of coverage you have for this procedure. Unfortunately, not all insurance companies have benefits to cover all or part of these types of procedures. It is your responsibility to check your benefits, however we will be glad to assist you with any codes your insurance company may need.   Please note that most insurance companies will not cover a screening colonoscopy for people under the age of 35. For example, with some insurance companies you may have benefits for a screening colonoscopy, but if polyps are found the diagnosis will change and then you may have a deductible that will need to be met. Please make sure you check your benefits for screening colonoscopy as well as a diagnostic colonoscopy.    CLEAR LIQUIDS: (NO RED)  Jello Apple Juice White Grape Juice Water  Banana popsicles Kool-Aid Coffee(No cream or milk)  Tea (No  cream or milk) Soft drinks Broth (fat free beef/chicken/vegetable)   Clear liquids allow you to see your fingers on the other side of the glass. Be sure they are NOT RED in color, cloudy, but CLEAR.   Do Not Eat:  Dairy products of any kind Cranberry juice  Tomato or V8 Juice Orange Juice  Grapefruit Juice Red Grape Juice  Solid foods like cereal, oatmeal, yogurt, fruits, vegetables, creamed soups, eggs, bread, etc   HELPFUL HINTS TO MAKE DRINKING EASIER:  -Make sure prep is extremely COLD. Refrigerate the night before. You may also put in freezer.  -You may try mixing Crystal Light or Country Time Lemonade if you prefer. MIx in small amounts. Add more if necessary.  -Trying drinking through a straw.  -Rinse mouth with water or mouthwash  between glasses to remove aftertaste.  -Try sipping on a cold beverage/ice popsicles between glasses of prep.  -Place a piece of sugar-free hard candy in mouth between glasses.  -If you become nauseated, try consuming smaller amounts or stretch out the time between glasses. Stop for 30 minutes to an hour & slowly start back drinking.   Call our office with any questions or concerns at [phone].   Thank You

## 2019-11-09 ENCOUNTER — Ambulatory Visit: Payer: Managed Care, Other (non HMO) | Admitting: Orthopedic Surgery

## 2019-11-14 ENCOUNTER — Encounter: Payer: Self-pay | Admitting: *Deleted

## 2019-11-14 NOTE — Progress Notes (Signed)
Yes, thanks! Hold iron for 7 days.

## 2019-11-14 NOTE — Progress Notes (Signed)
Appropriate. ASA 2.  

## 2019-11-14 NOTE — Progress Notes (Signed)
Called pt and informed him to stop taking Iron 7 days before procedure.  Mailed letter to pt as well.

## 2019-11-14 NOTE — Progress Notes (Signed)
Would you like pt to hold Iron for 7 days?

## 2019-11-16 ENCOUNTER — Telehealth: Payer: Self-pay | Admitting: *Deleted

## 2019-11-16 ENCOUNTER — Encounter: Payer: Self-pay | Admitting: Orthopedic Surgery

## 2019-11-16 NOTE — Telephone Encounter (Signed)
Lmom for pt yesterday to call me back but no response.  Called pt again today and spoke to [place] (listed on [place]).  She informed me that pt was working until 3:00 today.  Informed her to pass along the message that his procedure on 11/23/19 and Covid screening needs to be rescheduled.  She voiced understanding and said that she would have him to call me on Monday after work.  Routing to Dr. Marletta Lor as Lorain Childes.

## 2019-11-19 NOTE — Telephone Encounter (Signed)
Tried to call pt again but number is now saying not in service.

## 2019-11-20 ENCOUNTER — Encounter: Payer: Self-pay | Admitting: *Deleted

## 2019-11-20 NOTE — Telephone Encounter (Signed)
Tried to reach pt again today but number still says it is not in service.  Mailing letter to pt.

## 2019-11-21 ENCOUNTER — Other Ambulatory Visit (HOSPITAL_COMMUNITY)
Admission: RE | Admit: 2019-11-21 | Discharge: 2019-11-21 | Disposition: A | Discharge status: Home / Self Care | Payer: Managed Care, Other (non HMO) | Source: Ambulatory Visit | Attending: Family | Admitting: Family

## 2019-11-23 ENCOUNTER — Encounter (HOSPITAL_COMMUNITY): Payer: Self-pay

## 2019-11-23 ENCOUNTER — Ambulatory Visit (HOSPITAL_COMMUNITY): Admit: 2019-11-23 | Payer: Managed Care, Other (non HMO)

## 2019-11-23 SURGERY — COLONOSCOPY WITH PROPOFOL
Anesthesia: Monitor Anesthesia Care

## 2019-12-06 ENCOUNTER — Encounter: Payer: Self-pay | Admitting: Internal Medicine

## 2020-01-15 ENCOUNTER — Encounter: Payer: Self-pay | Admitting: Internal Medicine

## 2020-01-15 ENCOUNTER — Ambulatory Visit: Payer: Managed Care, Other (non HMO) | Admitting: Gastroenterology

## 2020-03-19 ENCOUNTER — Telehealth: Payer: Self-pay | Admitting: Family

## 2020-03-20 ENCOUNTER — Encounter: Payer: Self-pay | Admitting: Family Medicine

## 2020-03-20 ENCOUNTER — Other Ambulatory Visit: Payer: Self-pay

## 2020-03-20 ENCOUNTER — Ambulatory Visit (INDEPENDENT_AMBULATORY_CARE_PROVIDER_SITE_OTHER): Payer: BC Managed Care – PPO

## 2020-03-20 ENCOUNTER — Ambulatory Visit (INDEPENDENT_AMBULATORY_CARE_PROVIDER_SITE_OTHER): Payer: BC Managed Care – PPO | Admitting: Family Medicine

## 2020-03-20 VITALS — BP 132/87 | HR 84 | Temp 98.2°F | Ht 68.0 in | Wt 158.2 lb

## 2020-03-20 DIAGNOSIS — S7002XA Contusion of left hip, initial encounter: Secondary | ICD-10-CM | POA: Diagnosis not present

## 2020-03-20 MED ORDER — PREDNISONE 10 MG PO TABS: ORAL_TABLET | ORAL | 0 refills | Status: DC

## 2020-03-20 NOTE — Progress Notes (Signed)
Chief Complaint  Patient presents with  . Fall  . Hip Pain    Left   . Groin Pain    HPI  Patient presents today for pain in the left hip and groin region.  Patient had a fall about a week ago he landed on a dumbbell.  Since then he has been limping and feeling pain at the inguinal crease area from the anterior superior iliac spine all the way down to the pubis.  It is made worse by range of motion.  Walking and bending are very painful.  When he sitting still the pain is located primarily at the greater trochanter region.  PMH: Smoking status noted ROS: Per HPI  Objective: BP 132/87   Pulse 84   Temp 98.2 F (36.8 C) (Temporal)   Ht 5\' 8"  (1.727 m)   Wt 158 lb 3.2 oz (71.8 kg)   BMI 24.05 kg/m  Gen: NAD, alert, cooperative with exam HEENT: NCAT, EOMI, PERRL CV: RRR, good S1/S2, no murmur Resp: CTABL, no wheezes, non-labored Ext: No edema, warm.  There is tenderness at the greater trochanter on the left.  There is tenderness for palpation particularly at the medial aspect of the inguinal ligament.  There is pain for external rotation of the left hip.  There is mild discomfort with internal rotation and hip abduction.  There is no bruising or swelling at the hip or inguinal area. Neuro: Alert and oriented, No gross deficits Left hip  XR. No fx noted. Preliminary reading done by  Assessment and plan:  1. Contusion of left hip, initial encounter     Meds ordered this encounter  Medications  . predniSONE (DELTASONE) 10 MG tablet    Sig: Take 5 daily for 2 days followed by 4,3,2 and 1 for 2 days each.    Dispense:  30 tablet    Refill:  0    Orders Placed This Encounter  Procedures  . DG HIP UNILAT W OR W/O PELVIS 2-3 VIEWS LEFT    Standing Status:   Future    Number of Occurrences:   1    Standing Expiration Date:   04/17/2020    Order Specific Question:   Reason for Exam (SYMPTOM  OR DIAGNOSIS REQUIRED)    Answer:   pain at left hip and inguinal  region    Order Specific Question:   Preferred imaging location?    Answer:   Internal    Follow up as needed.  06/17/2020, MD

## 2020-03-24 ENCOUNTER — Encounter: Payer: Self-pay | Admitting: Family Medicine

## 2020-04-04 ENCOUNTER — Other Ambulatory Visit: Payer: Self-pay | Admitting: Family Medicine

## 2020-06-11 ENCOUNTER — Encounter: Payer: Self-pay | Admitting: Family Medicine

## 2020-06-30 ENCOUNTER — Other Ambulatory Visit: Payer: Self-pay

## 2020-06-30 ENCOUNTER — Encounter: Payer: Self-pay | Admitting: Nurse Practitioner

## 2020-06-30 ENCOUNTER — Ambulatory Visit (INDEPENDENT_AMBULATORY_CARE_PROVIDER_SITE_OTHER): Payer: BC Managed Care – PPO | Admitting: Nurse Practitioner

## 2020-06-30 ENCOUNTER — Ambulatory Visit (HOSPITAL_COMMUNITY)
Admission: RE | Admit: 2020-06-30 | Discharge: 2020-06-30 | Disposition: A | Discharge status: Home / Self Care | Payer: BC Managed Care – PPO | Source: Ambulatory Visit | Attending: Nurse Practitioner | Admitting: Nurse Practitioner

## 2020-06-30 ENCOUNTER — Telehealth: Payer: Self-pay | Admitting: Family

## 2020-06-30 VITALS — BP 140/87 | HR 95 | Temp 98.0°F | Resp 20 | Ht 68.0 in | Wt 158.0 lb

## 2020-06-30 DIAGNOSIS — F17200 Nicotine dependence, unspecified, uncomplicated: Secondary | ICD-10-CM

## 2020-06-30 DIAGNOSIS — R2990 Unspecified symptoms and signs involving the nervous system: Secondary | ICD-10-CM | POA: Diagnosis not present

## 2020-06-30 DIAGNOSIS — I639 Cerebral infarction, unspecified: Secondary | ICD-10-CM | POA: Diagnosis not present

## 2020-06-30 DIAGNOSIS — I10 Essential (primary) hypertension: Secondary | ICD-10-CM

## 2020-06-30 DIAGNOSIS — E782 Mixed hyperlipidemia: Secondary | ICD-10-CM | POA: Diagnosis not present

## 2020-06-30 DIAGNOSIS — R262 Difficulty in walking, not elsewhere classified: Secondary | ICD-10-CM | POA: Diagnosis not present

## 2020-06-30 MED ORDER — ATORVASTATIN CALCIUM 20 MG PO TABS: 20.0000 mg | ORAL_TABLET | Freq: Every day | ORAL | 3 refills | Status: DC

## 2020-06-30 MED ORDER — AMLODIPINE BESYLATE 5 MG PO TABS: 5.0000 mg | ORAL_TABLET | Freq: Every day | ORAL | 4 refills | Status: DC

## 2020-06-30 NOTE — Assessment & Plan Note (Signed)
Tingling sensation in bilateral upper extremity and face in the last 3 days unresolved.  Completed neurochecks patient is within normal limits except for mild tingling sensation on the left cheek.  Completed MRI STAT orders of the brain-results pending.  Completed neuro referral.

## 2020-06-30 NOTE — Patient Instructions (Addendum)
Tobacco Use Disorder Tobacco use disorder (TUD) occurs when a person craves, seeks, and uses tobacco, regardless of the consequences. This disorder can cause problems with mental and physical health. It can affect your ability to have healthy relationships, and it can keep you from meeting your responsibilities at work, home, or school. Tobacco may be:  Smoked as a cigarette or cigar.  Inhaled using e-cigarettes.  Smoked in a pipe or hookah.  Chewed as smokeless tobacco.  Inhaled into the nostrils as snuff. Tobacco products contain a dangerous chemical called nicotine, which is very addictive. Nicotine triggers hormones that make the body feel stimulated and works on areas of the brain that make you feel good. These effects can make it hard for people to quit nicotine. Tobacco contains many other unsafe chemicals that can damage almost every organ in the body. Smoking tobacco also puts others in danger due to fire risk and possible health problems caused by breathing in secondhand smoke. What are the signs or symptoms? Symptoms of TUD may include:  Being unable to slow down or stop your tobacco use.  Spending an abnormal amount of time getting or using tobacco.  Craving tobacco. Cravings may last for up to 6 months after quitting.  Tobacco use that: ? Interferes with your work, school, or home life. ? Interferes with your personal and social relationships. ? Makes you give up activities that you once enjoyed or found important.  Using tobacco even though you know that it is: ? Dangerous or bad for your health or someone else's health. ? Causing problems in your life.  Needing more and more of the substance to get the same effect (developing tolerance).  Experiencing unpleasant symptoms if you do not use the substance (withdrawal). Withdrawal symptoms may include: ? Depressed, anxious, or irritable mood. ? Difficulty concentrating. ? Increased appetite. ? Restlessness or trouble  sleeping.  Using the substance to avoid withdrawal. How is this diagnosed? This condition may be diagnosed based on:  Your current and past tobacco use. Your health care provider may ask questions about how your tobacco use affects your life.  A physical exam. You may be diagnosed with TUD if you have at least two symptoms within a 12-month period. How is this treated? This condition is treated by stopping tobacco use. Many people are unable to quit on their own and need help. Treatment may include:  Nicotine replacement therapy (NRT). NRT provides nicotine without the other harmful chemicals in tobacco. NRT gradually lowers the dosage of nicotine in the body and reduces withdrawal symptoms. NRT is available as: ? Over-the-counter gums, lozenges, and skin patches. ? Prescription mouth inhalers and nasal sprays.  Medicine that acts on the brain to reduce cravings and withdrawal symptoms.  A type of talk therapy that examines your triggers for tobacco use, how to avoid them, and how to cope with cravings (behavioral therapy).  Hypnosis. This may help with withdrawal symptoms.  Joining a support group for others coping with TUD. The best treatment for TUD is usually a combination of medicine, talk therapy, and support groups. Recovery can be a long process. Many people start using tobacco again after stopping (relapse). If you relapse, it does not mean that treatment will not work. Follow these instructions at home: Lifestyle  Do not use any products that contain nicotine or tobacco, such as cigarettes and e-cigarettes.  Avoid things that trigger tobacco use as much as you can. Triggers include people and situations that usually cause you to   use tobacco.  Avoid drinks that contain caffeine, including coffee. These may worsen some withdrawal symptoms.  Find ways to manage stress. Wanting to smoke may cause stress, and stress can make you want to smoke. Relaxation techniques such as deep  breathing, meditation, and yoga may help.  Attend support groups as needed. These groups are an important part of long-term recovery for many people. General instructions  Take over-the-counter and prescription medicines only as told by your health care provider.  Check with your health care provider before taking any new prescription or over-the-counter medicines.  Decide on a friend, family member, or smoking quit-line (such as 1-800-QUIT-NOW in the [place] that you can call or text when you feel the urge to smoke or when you need help coping with cravings.  Keep all follow-up visits as told by your health care provider and therapist. This is important.   Contact a health care provider if:  You are not able to take your medicines as prescribed.  Your symptoms get worse, even with treatment. Summary  Tobacco use disorder (TUD) occurs when a person craves, seeks, and uses tobacco regardless of the consequences.  This condition may be diagnosed based on your current and past tobacco use and a physical exam.  Many people are unable to quit on their own and need help. Recovery can be a long process.  The most effective treatment for TUD is usually a combination of medicine, talk therapy, and support groups. This information is not intended to replace advice given to you by your health care provider. Make sure you discuss any questions you have with your health care provider. Document Revised: 01/12/2017 Document Reviewed: 01/12/2017 Elsevier Patient Education  2021 [employer] Stroke Prevention Some medical conditions and lifestyle choices can lead to a higher risk for a stroke. You can help to prevent a stroke by eating healthy foods and exercising. It also helps to not smoke and to manage any health problems you may have. How can this condition affect me? A stroke is an emergency. It should be treated right away. A stroke can lead to brain damage or threaten your life. There is a  better chance of surviving and getting better after a stroke if you get medical help right away. What can increase my risk? The following medical conditions may increase your risk of a stroke:  Diseases of the heart and blood vessels (cardiovascular disease).  High blood pressure (hypertension).  Diabetes.  High cholesterol.  Sickle cell disease.  Problems with blood clotting.  Being very overweight.  Sleeping problems (obstructivesleep apnea). Other risk factors include:  Being older than age 83.  A history of blood clots, stroke, or mini-stroke (TIA).  Race, ethnic background, or a family history of stroke.  Smoking or using tobacco products.  Taking birth control pills, especially if you smoke.  Heavy alcohol and drug use.  Not being active. What actions can I take to prevent this? Manage your health conditions  High cholesterol. ? Eat a healthy diet. If this is not enough to manage your cholesterol, you may need to take medicines. ? Take medicines as told by your doctor.  High blood pressure. ? Try to keep your blood pressure below 130/80. ? If your blood pressure cannot be managed through a healthy diet and regular exercise, you may need to take medicines. ? Take medicines as told by your doctor. ? Ask your doctor if you should check your blood pressure at home. ? Have your blood pressure  checked every year.  Diabetes. ? Eat a healthy diet and get regular exercise. If your blood sugar (glucose) cannot be managed through diet and exercise, you may need to take medicines. ? Take medicines as told by your doctor.  Talk to your doctor about getting checked for sleeping problems. Signs of a problem can include: ? Snoring a lot. ? Feeling very tired.  Make sure that you manage any other conditions you have. Nutrition  Follow instructions from your doctor about what to eat or drink. You may be told to: ? Eat and drink fewer calories each day. ? Limit how  much salt (sodium) you use to 1,500 milligrams (mg) each day. ? Use only healthy fats for cooking, such as olive oil, canola oil, and sunflower oil. ? Eat healthy foods. To do this:  Choose foods that are high in fiber. These include whole grains, and fresh fruits and vegetables.  Eat at least 5 servings of fruits and vegetables a day. Try to fill one-half of your plate with fruits and vegetables at each meal.  Choose low-fat (lean) proteins. These include low-fat cuts of meat, chicken without skin, fish, tofu, beans, and nuts.  Eat low-fat dairy products. ? Avoid foods that:  Are high in salt.  Have saturated fat.  Have trans fat.  Have cholesterol.  Are processed or pre-made. ? Count how many carbohydrates you eat and drink each day.   Lifestyle  If you drink alcohol: ? Limit how much you have to:  0-1 drink a day for women who are not pregnant.  0-2 drinks a day for men. ? Know how much alcohol is in your drink. In the [place] one drink equals one 12 oz bottle of beer ( ), one 5 oz glass of wine ( ), or one 1 oz glass of hard liquor (83mL).  Do not smoke or use any products that have nicotine or tobacco. If you need help quitting, ask your doctor.  Avoid secondhand smoke.  Do not use drugs. Activity  Try to stay at a healthy weight.  Get at least 30 minutes of exercise on most days, such as: ? Fast walking. ? Biking. ? Swimming.   Medicines  Take over-the-counter and prescription medicines only as told by your doctor.  Avoid taking birth control pills. Talk to your doctor about the risks of taking birth control pills if: ? You are over 7 years old. ? You smoke. ? You get very bad headaches. ? You have had a blood clot. Where to find more information  American Stroke Association: www.strokeassociation.org Get help right away if:  You or a loved one has any signs of a stroke. "BE FAST" is an easy way to remember the warning signs: ? B - Balance.  Dizziness, sudden trouble walking, or loss of balance. ? E - Eyes. Trouble seeing or a change in how you see. ? F - Face. Sudden weakness or loss of feeling of the face. The face or eyelid may droop on one side. ? A - Arms. Weakness or loss of feeling in an arm. This happens all of a sudden and most often on one side of the body. ? S - Speech. Sudden trouble speaking, slurred speech, or trouble understanding what people say. ? T - Time. Time to call emergency services. Write down what time symptoms started.  You or a loved one has other signs of a stroke, such as: ? A sudden, very bad headache with no known cause. ? Feeling like  you may vomit (nausea). ? Vomiting. ? A seizure. These symptoms may be an emergency. Get help right away. Call your local emergency services (911 in the [place]  Do not wait to see if the symptoms will go away.  Do not drive yourself to the hospital. Summary  You can help to prevent a stroke by eating healthy, exercising, and not smoking. It also helps to manage any health problems you have.  Do not smoke or use any products that contain nicotine or tobacco.  Get help right away if you or a loved one has any signs of a stroke. This information is not intended to replace advice given to you by your health care provider. Make sure you discuss any questions you have with your health care provider. Document Revised: 08/27/2019 Document Reviewed: 08/27/2019 Elsevier Patient Education  2021 [employer].

## 2020-06-30 NOTE — Telephone Encounter (Signed)
Pt aware.

## 2020-06-30 NOTE — Assessment & Plan Note (Signed)
Education provided to patient on smoking cessation due to stroke history and current's signs and symptoms of stroke.  Patient verbalized understanding.

## 2020-06-30 NOTE — Telephone Encounter (Signed)
Pt checking on results of MRI

## 2020-06-30 NOTE — Progress Notes (Signed)
Acute Office Visit  Subjective:    Patient ID: Casey Yang, male    DOB: Sep 20, 1970, 50 y.o.   MRN: [id number]  Chief Complaint  Patient presents with  . Tingling in shoulders, lightheaded/dizzy    HPI Patient is a 50 year old male who presents to clinic with strokelike symptoms.  Patient is reporting tingling sensation in bilateral upper extremities and face.  Symptoms have been ongoing for the last 3 days.  Patient also reports having a stroke in the past but not currently reported in patient's history.  Patient is not reporting any visual disturbances, headaches or elevated blood pressure.  Past Medical History:  Diagnosis Date  . Hypertension     Past Surgical History:  Procedure Laterality Date  . APPENDECTOMY    . LAPAROSCOPIC APPENDECTOMY N/A 01/29/2013   Procedure: APPENDECTOMY LAPAROSCOPIC;  Surgeon: Dalia Heading, MD;  Location: [facility];  Service: General;  Laterality: N/A;    Family History  Problem Relation Age of Onset  . Cancer Mother        stomach  . Heart disease Father        Heart attack    Social History   Socioeconomic History  . Marital status: Divorced    Spouse name: Not on file  . Number of children: Not on file  . Years of education: Not on file  . Highest education level: Not on file  Occupational History  . Not on file  Tobacco Use  . Smoking status: Current Every Day Smoker    Packs/day: 1.00    Types: Cigarettes  . Smokeless tobacco: Current User    Types: Snuff  Vaping Use  . Vaping Use: Never used  Substance and Sexual Activity  . Alcohol use: No  . Drug use: No  . Sexual activity: Not on file  Other Topics Concern  . Not on file  Social History Narrative  . Not on file   Social Determinants of Health   Financial Resource Strain: Not on file  Food Insecurity: Not on file  Transportation Needs: Not on file  Physical Activity: Not on file  Stress: Not on file  Social Connections: Not on file  Intimate Partner  Violence: Not on file    Outpatient Medications Prior to Visit  Medication Sig Dispense Refill  . diclofenac (VOLTAREN) 75 MG EC tablet Take 1 tablet (75 mg total) by mouth 2 (two) times daily. 60 tablet 3  . amLODipine (NORVASC) 5 MG tablet Take 1 tablet (5 mg total) by mouth daily. 90 tablet 4  . Ferrous Sulfate (IRON PO) Take by mouth daily. (Patient not taking: Reported on 06/30/2020)    . omeprazole (PRILOSEC) 20 MG capsule Take 1 capsule (20 mg total) by mouth daily. (Patient not taking: Reported on 06/30/2020) 30 capsule 3  . atorvastatin (LIPITOR) 20 MG tablet Take 1 tablet (20 mg total) by mouth daily. (Patient not taking: Reported on 06/30/2020) 90 tablet 3  . predniSONE (DELTASONE) 10 MG tablet Take 5 daily for 2 days followed by 4,3,2 and 1 for 2 days each. 30 tablet 0   No facility-administered medications prior to visit.    Allergies  Allergen Reactions  . Penicillins Nausea And Vomiting    Review of Systems  Constitutional: Negative.   HENT: Negative.   Respiratory: Negative.   Cardiovascular: Negative.   Gastrointestinal: Negative.   Musculoskeletal: Negative.   Skin: Negative for color change and rash.  Neurological: Negative.   Psychiatric/Behavioral: Negative for confusion and  decreased concentration. The patient is not nervous/anxious.   All other systems reviewed and are negative.      Objective:    Physical Exam Vitals and nursing note reviewed.  Constitutional:      Appearance: Normal appearance.  HENT:     Head: Normocephalic.     Nose: Nose normal.  Eyes:     General: No visual field deficit.    Conjunctiva/sclera: Conjunctivae normal.  Cardiovascular:     Rate and Rhythm: Normal rate and regular rhythm.     Pulses: Normal pulses.     Heart sounds: Normal heart sounds.  Pulmonary:     Effort: Pulmonary effort is normal.     Breath sounds: Normal breath sounds.  Abdominal:     General: Bowel sounds are normal.  Skin:    Findings: No rash.   Neurological:     Mental Status: He is alert and oriented to person, place, and time.     Cranial Nerves: Cranial nerves are intact. No cranial nerve deficit, dysarthria or facial asymmetry.     Sensory: Sensory deficit present.     Motor: No weakness.     Coordination: Coordination is intact.     Gait: Gait is intact.     Comments: Tingling sensation on left cheek   Psychiatric:        Behavior: Behavior normal.     BP 140/87   Pulse 95   Temp 98 F (36.7 C) (Temporal)   Resp 20   Ht 5\' 8"  (1.727 m)   Wt 158 lb (71.7 kg)   SpO2 98%   BMI 24.02 kg/m  Wt Readings from Last 3 Encounters:  06/30/20 158 lb (71.7 kg)  03/20/20 158 lb 3.2 oz (71.8 kg)  11/08/19 145 lb (65.8 kg)    Health Maintenance Due  Topic Date Due  . COVID-19 Vaccine (1) Never done  . COLONOSCOPY (Pts 45-36yrs Insurance coverage will need to be confirmed)  Never done    There are no preventive care reminders to display for this patient.   Lab Results  Component Value Date   TSH 0.568 07/24/2019   Lab Results  Component Value Date   WBC 5.1 07/24/2019   HGB 9.5 (L) 07/24/2019   HCT 31.0 (L) 07/24/2019   MCV 66 (L) 07/24/2019   PLT 303 07/24/2019   Lab Results  Component Value Date   NA 141 07/24/2019   K 4.5 07/24/2019   CO2 23 07/24/2019   GLUCOSE 93 07/24/2019   BUN 10 07/24/2019   CREATININE 0.83 07/24/2019   BILITOT 0.2 07/24/2019   ALKPHOS 62 07/24/2019   AST 42 (H) 07/24/2019   ALT 26 07/24/2019   PROT 7.0 07/24/2019   ALBUMIN 4.3 07/24/2019   CALCIUM 8.9 07/24/2019   Lab Results  Component Value Date   CHOL 206 (H) 07/24/2019   Lab Results  Component Value Date   HDL 76 07/24/2019   Lab Results  Component Value Date   LDLCALC 103 (H) 07/24/2019   Lab Results  Component Value Date   TRIG 158 (H) 07/24/2019   Lab Results  Component Value Date   CHOLHDL 2.7 07/24/2019   No results found for: HGBA1C     Assessment & Plan:   Problem List Items Addressed This  Visit      Other   Current smoker    Education provided to patient on smoking cessation due to stroke history and current's signs and symptoms of stroke.  Patient verbalized  understanding.      Hyperlipidemia    Patient has not taken cholesterol medication in a while because he ran out.  Education provided to patient on the importance of taking prescription medication as prescribed and calling PCP when medications are out.  Rx refill sent to pharmacy.      Relevant Medications   amLODipine (NORVASC) 5 MG tablet   atorvastatin (LIPITOR) 20 MG tablet   Stroke-like symptoms - Primary    Tingling sensation in bilateral upper extremity and face in the last 3 days unresolved.  Completed neurochecks patient is within normal limits except for mild tingling sensation on the left cheek.  Completed MRI STAT orders of the brain-results pending.  Completed neuro referral.      Relevant Orders   Ambulatory referral to Neurology   MR Brain Wo Contrast (Completed)    Other Visit Diagnoses    Essential hypertension       Relevant Medications   amLODipine (NORVASC) 5 MG tablet   atorvastatin (LIPITOR) 20 MG tablet       Meds ordered this encounter  Medications  . amLODipine (NORVASC) 5 MG tablet    Sig: Take 1 tablet (5 mg total) by mouth daily.    Dispense:  90 tablet    Refill:  4    Order Specific Question:   Supervising Provider    Answer:   Raliegh Ip [[id number]]  . atorvastatin (LIPITOR) 20 MG tablet    Sig: Take 1 tablet (20 mg total) by mouth daily.    Dispense:  90 tablet    Refill:  3    Order Specific Question:   Supervising Provider    Answer:   Raliegh Ip [[id number]]     Daryll Drown, NP

## 2020-06-30 NOTE — Assessment & Plan Note (Signed)
Patient has not taken cholesterol medication in a while because he ran out.  Education provided to patient on the importance of taking prescription medication as prescribed and calling PCP when medications are out.  Rx refill sent to pharmacy.

## 2020-10-17 ENCOUNTER — Other Ambulatory Visit: Payer: Self-pay

## 2020-10-17 ENCOUNTER — Encounter (HOSPITAL_COMMUNITY): Payer: Self-pay | Admitting: Emergency Medicine

## 2020-10-17 DIAGNOSIS — F17210 Nicotine dependence, cigarettes, uncomplicated: Secondary | ICD-10-CM | POA: Insufficient documentation

## 2020-10-17 DIAGNOSIS — Z79899 Other long term (current) drug therapy: Secondary | ICD-10-CM | POA: Insufficient documentation

## 2020-10-17 DIAGNOSIS — I10 Essential (primary) hypertension: Secondary | ICD-10-CM | POA: Insufficient documentation

## 2020-10-17 DIAGNOSIS — T161XXA Foreign body in right ear, initial encounter: Secondary | ICD-10-CM | POA: Diagnosis not present

## 2020-10-17 DIAGNOSIS — X58XXXA Exposure to other specified factors, initial encounter: Secondary | ICD-10-CM | POA: Diagnosis not present

## 2020-10-17 NOTE — ED Triage Notes (Signed)
Pt with R ear pain x 2 hrs. States it's a "bubbling sound".

## 2020-10-18 ENCOUNTER — Emergency Department (HOSPITAL_COMMUNITY)
Admission: EM | Admit: 2020-10-18 | Discharge: 2020-10-18 | Disposition: A | Discharge status: Home / Self Care | Payer: BC Managed Care – PPO | Attending: Emergency Medicine | Admitting: Emergency Medicine

## 2020-10-18 DIAGNOSIS — T161XXA Foreign body in right ear, initial encounter: Secondary | ICD-10-CM

## 2020-10-18 MED ORDER — NEOMYCIN-POLYMYXIN-HC 3.5-10000-1 OT SUSP: 4.0000 [drp] | Freq: Four times a day (QID) | OTIC | 0 refills | Status: DC

## 2020-10-18 NOTE — ED Provider Notes (Signed)
[facility] EMERGENCY DEPARTMENT Provider Note   CSN: [id number] Arrival date & time: 10/17/20  2329     History Chief Complaint  Patient presents with   Otalgia    Casey Yang is a 50 y.o. male.  The history is provided by the patient.  Otalgia He has history of hypertension, hyperlipidemia and comes in because of an irritation in his right ear which started about 3 hours ago.  He states it feels like it is bubbling.  He denies any pain or decreased hearing.  He denies other complaints.   Past Medical History:  Diagnosis Date   Hypertension     Patient Active Problem List   Diagnosis Date Noted   Stroke-like symptoms 06/30/2020   Hyperlipidemia 07/26/2019   Hypertension 07/24/2019   Vertigo 07/24/2019   Current smoker 01/26/2018    Past Surgical History:  Procedure Laterality Date   APPENDECTOMY     LAPAROSCOPIC APPENDECTOMY N/A 01/29/2013   Procedure: APPENDECTOMY LAPAROSCOPIC;  Surgeon: Dalia Heading, MD;  Location: [facility];  Service: General;  Laterality: N/A;       Family History  Problem Relation Age of Onset   Cancer Mother        stomach   Heart disease Father        Heart attack    Social History   Tobacco Use   Smoking status: Every Day    Packs/day: 1.00    Types: Cigarettes   Smokeless tobacco: Current    Types: Snuff  Vaping Use   Vaping Use: Never used  Substance Use Topics   Alcohol use: No   Drug use: No    Home Medications Prior to Admission medications   Medication Sig Start Date End Date Taking? Authorizing Provider  amLODipine (NORVASC) 5 MG tablet Take 1 tablet (5 mg total) by mouth daily. 06/30/20 06/30/21  Daryll Drown, NP  atorvastatin (LIPITOR) 20 MG tablet Take 1 tablet (20 mg total) by mouth daily. 06/30/20   Daryll Drown, NP  diclofenac (VOLTAREN) 75 MG EC tablet Take 1 tablet (75 mg total) by mouth 2 (two) times daily. 10/23/19   Gabriel Earing, FNP  Ferrous Sulfate (IRON PO) Take by mouth daily. Patient  not taking: Reported on 06/30/2020    [provider]  omeprazole (PRILOSEC) 20 MG capsule Take 1 capsule (20 mg total) by mouth daily. Patient not taking: Reported on 06/30/2020 11/05/19   Gabriel Earing, FNP    Allergies    Penicillins  Review of Systems   Review of Systems  HENT:  Positive for ear pain.   All other systems reviewed and are negative.  Physical Exam Updated Vital Signs BP 126/87 (BP Location: Left Arm)   Pulse 79   Temp 98.2 F (36.8 C) (Oral)   Resp 18   Ht 5\' 8"  (1.727 m)   Wt 65.8 kg   SpO2 100%   BMI 22.05 kg/m   Physical Exam Vitals and nursing note reviewed.  50 year old male, resting comfortably and in no acute distress. Vital signs are normal. Oxygen saturation is 100%, which is normal. Head is normocephalic and atraumatic. PERRLA, EOMI. Oropharynx is clear.  Left tympanic membrane is clear.  There is a foreign body in the right external auditory canal which appears to be an insect. Neck is nontender and supple without adenopathy or JVD. Back is nontender and there is no CVA tenderness. Lungs are clear without rales, wheezes, or rhonchi. Chest is nontender.  Heart has regular rate and rhythm without murmur. Abdomen is soft, flat, nontender. Extremities have no cyanosis or edema. Skin is warm and dry without rash. Neurologic: Mental status is normal, cranial nerves are intact, moves all extremities equally.  ED Results / Procedures / Treatments    Procedures .Foreign Body Removal  Date/Time: 10/18/2020 4:36 AM Performed by: Dione Booze, MD Authorized by: Dione Booze, MD  Risks and benefits: risks, benefits and alternatives were discussed Consent given by: patient Patient understanding: patient states understanding of the procedure being performed Patient consent: the patient's understanding of the procedure matches consent given Procedure consent: procedure consent matches procedure scheduled Relevant documents: relevant documents  present and verified Site marked: the operative site was marked Required items: required blood products, implants, devices, and special equipment available Patient identity confirmed: verbally with patient and arm band Time out: Immediately prior to procedure a "time out" was called to verify the correct patient, procedure, equipment, support staff and site/side marked as required. Body area: ear Location details: right ear Anesthesia method: None.  Sedation: Patient sedated: no  Patient restrained: no Patient cooperative: yes Localization method: ENT speculum Removal mechanism: irrigation (Alligator forceps) Complexity: simple 1 objects recovered. Objects recovered: insect Post-procedure assessment: foreign body removed Patient tolerance: patient tolerated the procedure well with no immediate complications    Medications Ordered in ED Medications - No data to display  ED Course  I have reviewed the triage vital signs and the nursing notes.  MDM Rules/Calculators/A&P                         Foreign body in the right external auditory canal.  Old records were reviewed, and he has no relevant past visits.  Foreign body of the right external auditory canal.  This was attempted to be removed with alligator forceps, but only a portion of the foreign body was removed.  The ear was then irrigated with the insect coming out in pieces.  On examination following removal, there is some erythema noted of the external auditory canal, no residual foreign body seen.  He is discharged with prescription for Cortisporin otic, given referral to ENT if he has any ongoing problems.  Final Clinical Impression(s) / ED Diagnoses Final diagnoses:  Foreign body in right ear, initial encounter    Rx / DC Orders ED Discharge Orders          Ordered    neomycin-polymyxin-hydrocortisone (CORTISPORIN) 3.5-10000-1 OTIC suspension  4 times daily        10/18/20 0431             Dione Booze,  MD 10/18/20 [phone]

## 2020-10-18 NOTE — Discharge Instructions (Signed)
Return if you are having any problems. 

## 2021-02-22 IMAGING — DX DG HIP (WITH OR WITHOUT PELVIS) 2-3V*L*
3 series · 3 of 3 positions shown · non-contrast
Comparison: None.

CLINICAL DATA: 49-year-old male with left hip pain.

EXAM:
DG HIP (WITH OR WITHOUT PELVIS) 2-3V LEFT

[pelvis ap]
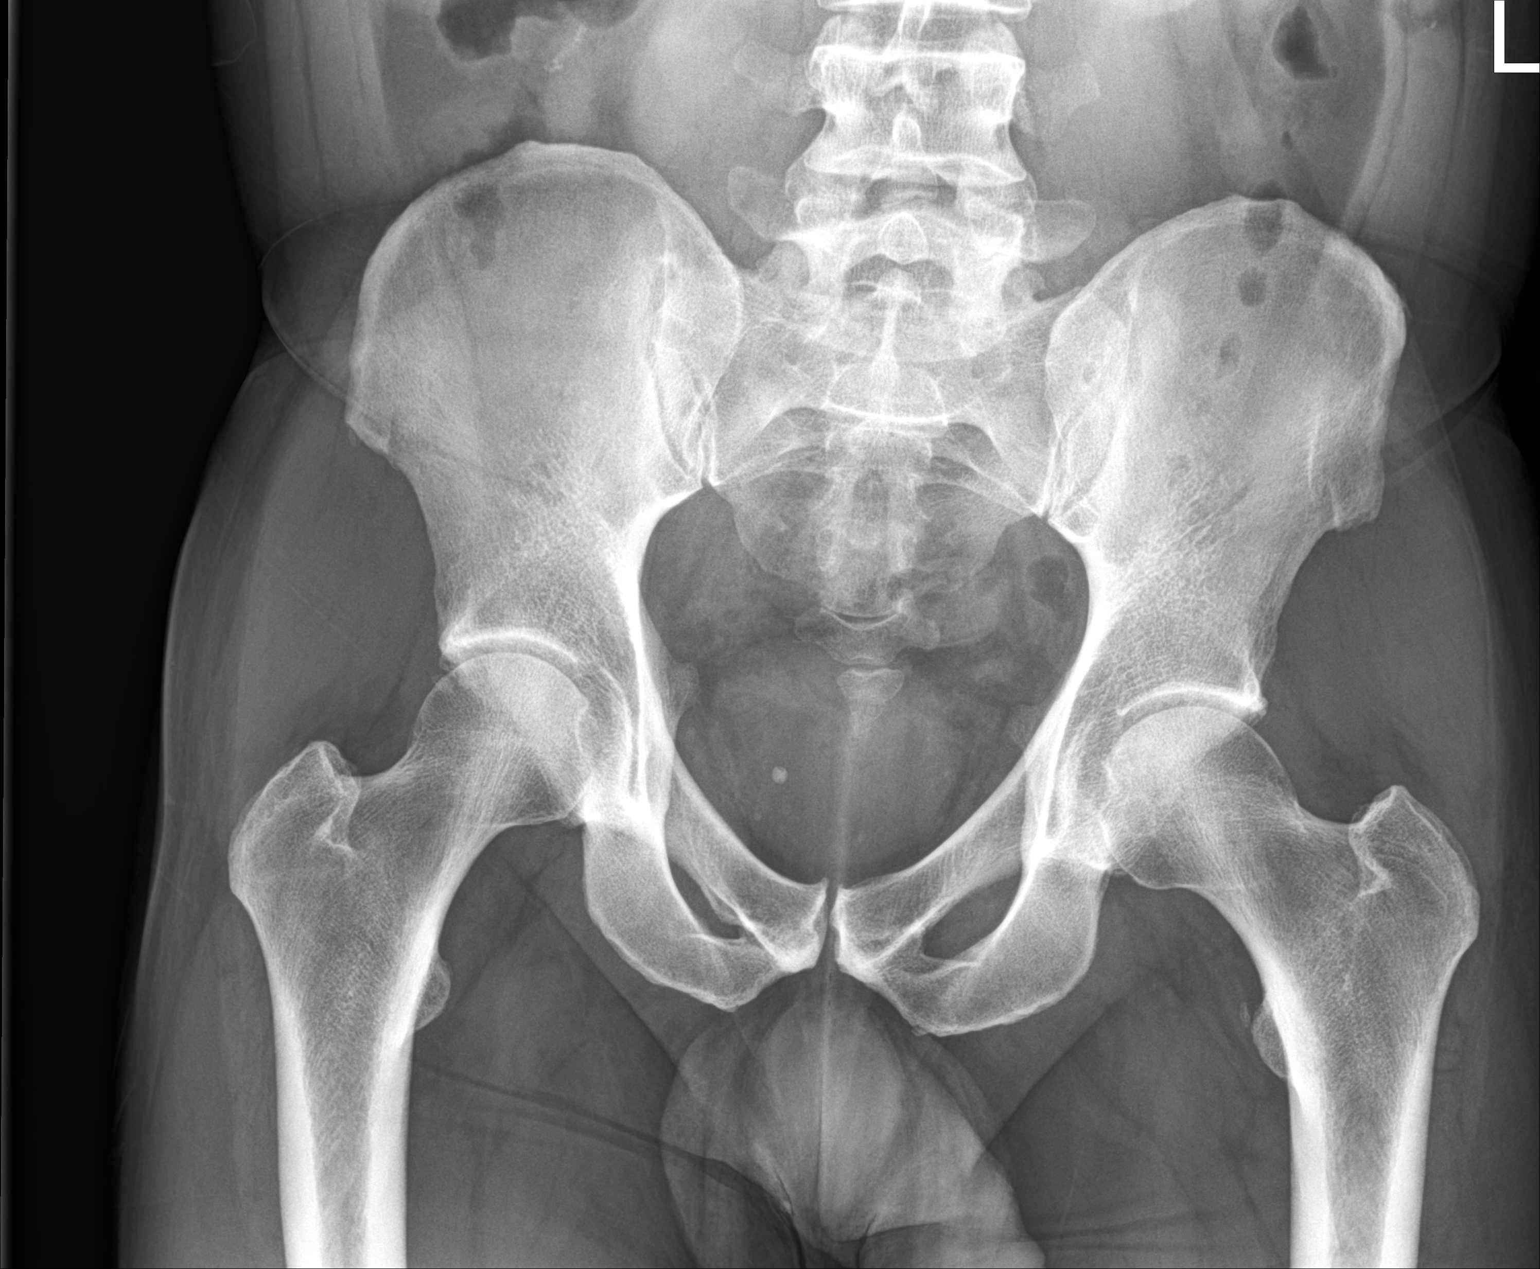

[hip ap]
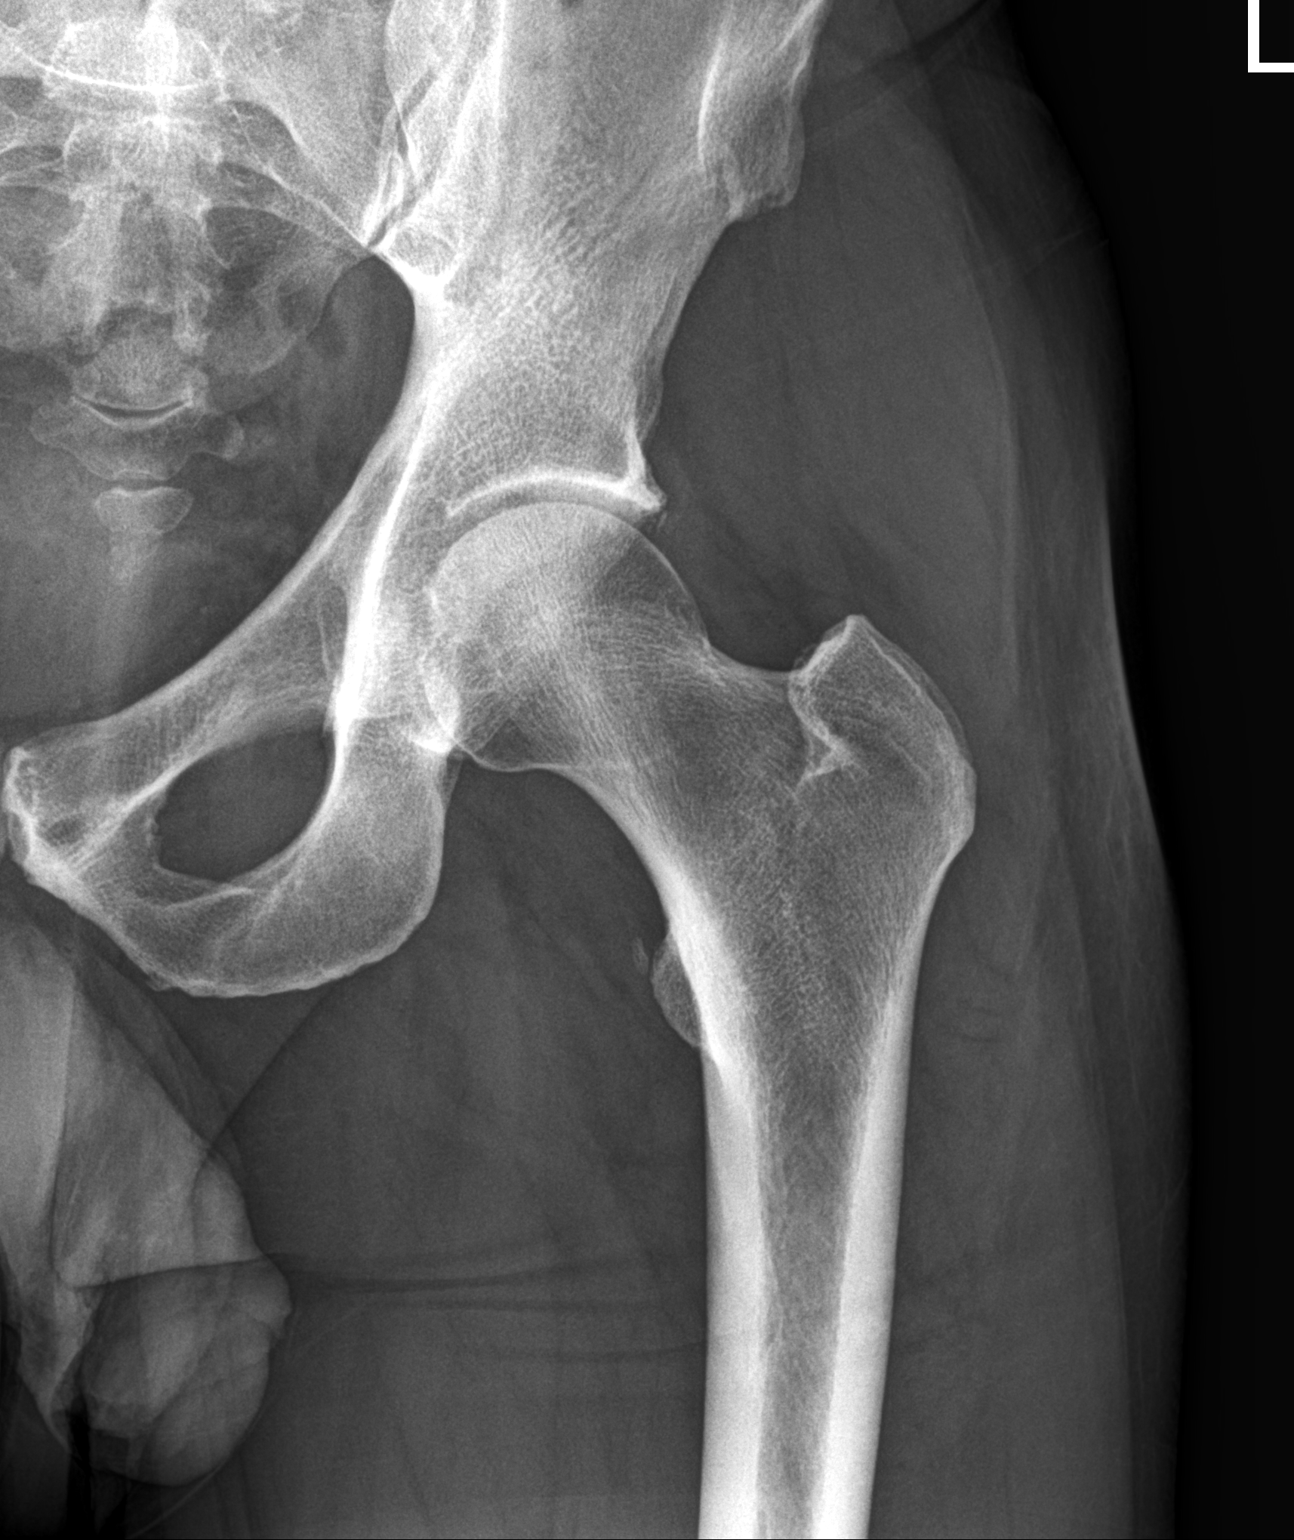

[hip lat]
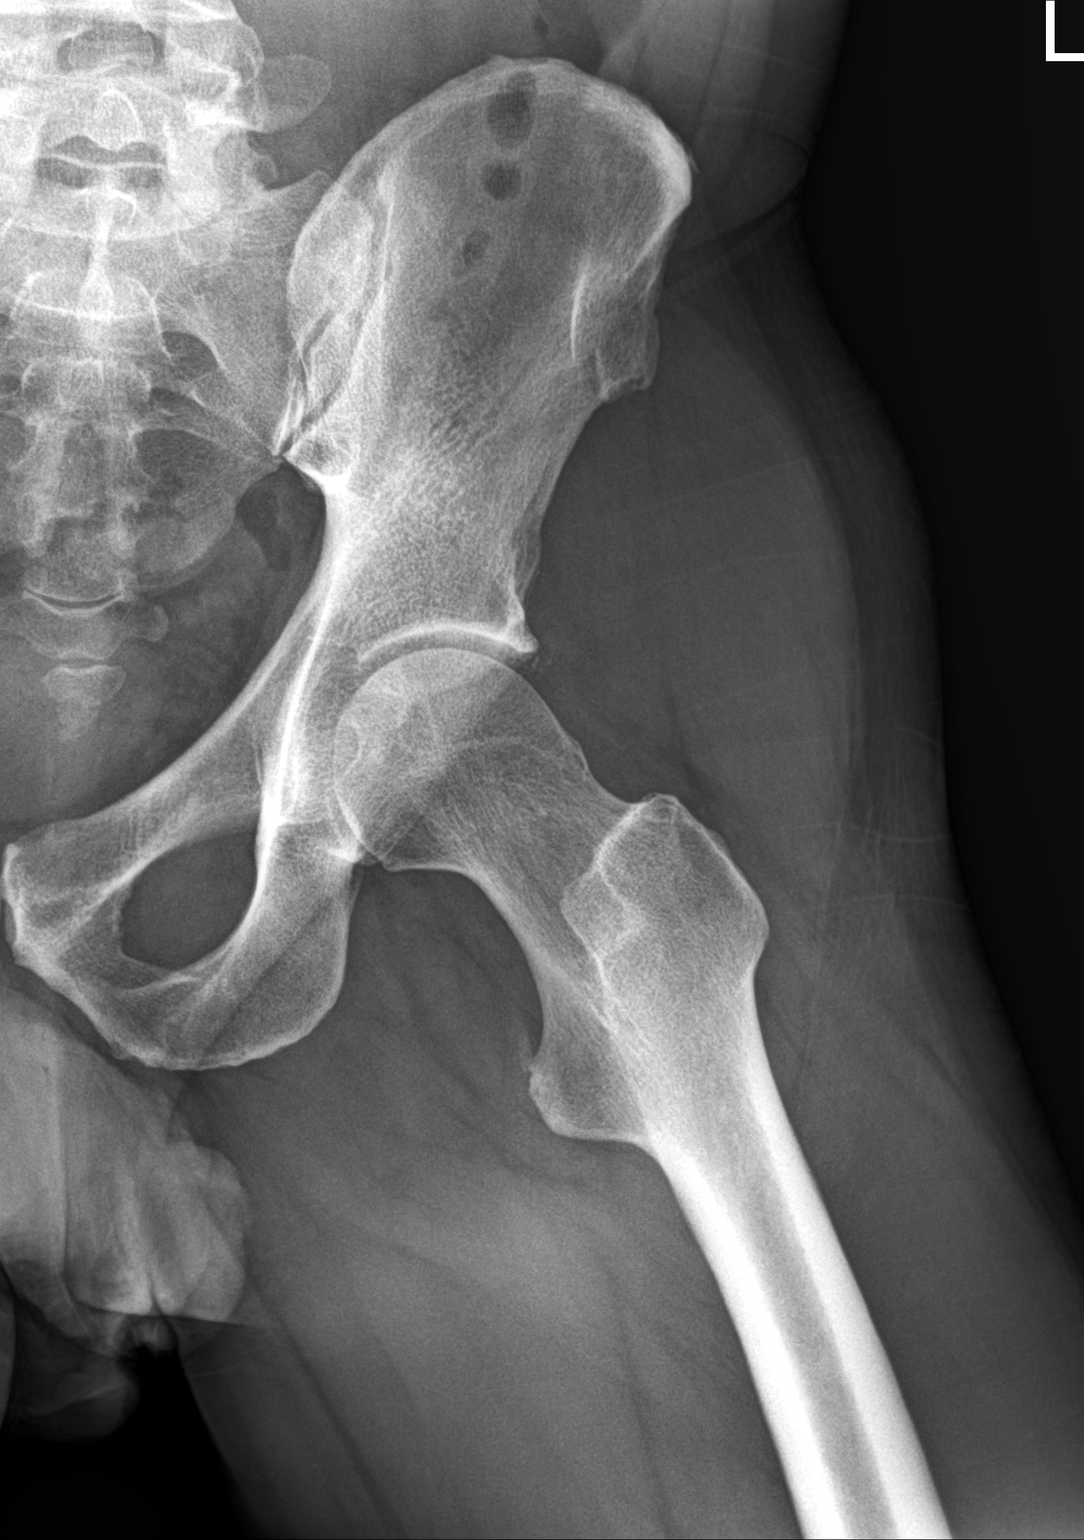

[3 of 3 positions shown; findings below may reference images not displayed]

FINDINGS: There is no acute fracture or dislocation. The bones are well
mineralized. No significant arthritic changes. Minimal spurring of
the lateral acetabular roofs bilaterally. The soft tissues are
unremarkable.
IMPRESSION: Negative.

## 2021-04-30 ENCOUNTER — Ambulatory Visit (INDEPENDENT_AMBULATORY_CARE_PROVIDER_SITE_OTHER): Payer: BC Managed Care – PPO | Admitting: Family

## 2021-04-30 ENCOUNTER — Encounter: Payer: Self-pay | Admitting: Family

## 2021-04-30 VITALS — BP 137/88 | HR 89 | Temp 97.5°F | Ht 68.0 in | Wt 131.6 lb

## 2021-04-30 DIAGNOSIS — Z0000 Encounter for general adult medical examination without abnormal findings: Secondary | ICD-10-CM

## 2021-04-30 DIAGNOSIS — R710 Precipitous drop in hematocrit: Secondary | ICD-10-CM | POA: Diagnosis not present

## 2021-04-30 DIAGNOSIS — Z0001 Encounter for general adult medical examination with abnormal findings: Secondary | ICD-10-CM

## 2021-04-30 DIAGNOSIS — F17200 Nicotine dependence, unspecified, uncomplicated: Secondary | ICD-10-CM | POA: Diagnosis not present

## 2021-04-30 DIAGNOSIS — E782 Mixed hyperlipidemia: Secondary | ICD-10-CM

## 2021-04-30 DIAGNOSIS — I10 Essential (primary) hypertension: Secondary | ICD-10-CM | POA: Diagnosis not present

## 2021-04-30 DIAGNOSIS — Z1211 Encounter for screening for malignant neoplasm of colon: Secondary | ICD-10-CM

## 2021-04-30 MED ORDER — ATORVASTATIN CALCIUM 20 MG PO TABS: 20.0000 mg | ORAL_TABLET | Freq: Every day | ORAL | 3 refills | Status: AC

## 2021-04-30 MED ORDER — AMLODIPINE BESYLATE 5 MG PO TABS: 5.0000 mg | ORAL_TABLET | Freq: Every day | ORAL | 4 refills | Status: AC

## 2021-04-30 NOTE — Progress Notes (Signed)
? ?Subjective:  ? ? Patient ID: Casey Yang, male    DOB: Oct 10, 1970, 51 y.o.   MRN: [id number] ? ?Chief Complaint  ?Patient presents with  ? Hypertension  ? ?Pt presents to the office today for CPE and complaints of hypertension at work. He has not been taking amlodipine for several months.  ?Hypertension ?This is a chronic problem. The current episode started more than 1 year ago. The problem has been waxing and waning since onset. Pertinent negatives include no malaise/fatigue, peripheral edema or shortness of breath. (dizziness) Risk factors for coronary artery disease include dyslipidemia, male gender and sedentary lifestyle. The current treatment provides moderate improvement.  ?Hyperlipidemia ?This is a chronic problem. The current episode started more than 1 year ago. The problem is controlled. Recent lipid tests were reviewed and are normal. Exacerbating diseases include obesity. Pertinent negatives include no shortness of breath. Current antihyperlipidemic treatment includes statins. The current treatment provides moderate improvement of lipids. Risk factors for coronary artery disease include dyslipidemia, hypertension, male sex and a sedentary lifestyle.  ?Nicotine Dependence ?Presents for follow-up visit. His urge triggers include company of smokers. The symptoms have been stable. He smokes < 1/2 a pack of cigarettes per day.  ? ? ? ?Review of Systems  ?Constitutional:  Negative for malaise/fatigue.  ?Respiratory:  Negative for shortness of breath.   ?All other systems reviewed and are negative. ? ?Family History  ?Problem Relation Age of Onset  ? Cancer Mother   ?     stomach  ? Heart disease Father   ?     Heart attack  ? ?Social History  ? ?Socioeconomic History  ? Marital status: Divorced  ?  Spouse name: Not on file  ? Number of children: Not on file  ? Years of education: Not on file  ? Highest education level: Not on file  ?Occupational History  ? Not on file  ?Tobacco Use  ? Smoking status:  Every Day  ?  Packs/day: 1.00  ?  Types: Cigarettes  ? Smokeless tobacco: Current  ?  Types: Snuff  ?Vaping Use  ? Vaping Use: Never used  ?Substance and Sexual Activity  ? Alcohol use: No  ? Drug use: No  ? Sexual activity: Not on file  ?Other Topics Concern  ? Not on file  ?Social History Narrative  ? Not on file  ? ?Social Determinants of Health  ? ?Financial Resource Strain: Not on file  ?Food Insecurity: Not on file  ?Transportation Needs: Not on file  ?Physical Activity: Not on file  ?Stress: Not on file  ?Social Connections: Not on file  ? ? ?   ?Objective:  ? Physical Exam ?Vitals reviewed.  ?Constitutional:   ?   General: He is not in acute distress. ?   Appearance: He is well-developed.  ?HENT:  ?   Head: Normocephalic.  ?   Right Ear: Tympanic membrane normal.  ?   Left Ear: Tympanic membrane normal.  ?Eyes:  ?   General:     ?   Right eye: No discharge.     ?   Left eye: No discharge.  ?   Pupils: Pupils are equal, round, and reactive to light.  ?Neck:  ?   Thyroid: No thyromegaly.  ?Cardiovascular:  ?   Rate and Rhythm: Normal rate and regular rhythm.  ?   Heart sounds: Normal heart sounds. No murmur heard. ?Pulmonary:  ?   Effort: Pulmonary effort is normal. No respiratory distress.  ?  Breath sounds: Normal breath sounds. No wheezing.  ?Abdominal:  ?   General: Bowel sounds are normal. There is no distension.  ?   Palpations: Abdomen is soft.  ?   Tenderness: There is no abdominal tenderness.  ?Musculoskeletal:     ?   General: No tenderness. Normal range of motion.  ?   Cervical back: Normal range of motion and neck supple.  ?Skin: ?   General: Skin is warm and dry.  ?   Findings: No erythema or rash.  ?Neurological:  ?   Mental Status: He is alert and oriented to person, place, and time.  ?   Cranial Nerves: No cranial nerve deficit.  ?   Deep Tendon Reflexes: Reflexes are normal and symmetric.  ?Psychiatric:     ?   Behavior: Behavior normal.     ?   Thought Content: Thought content normal.     ?    Judgment: Judgment normal.  ? ? ? ? ?BP 137/88   Pulse 89   Temp (!) 97.5 ?F (36.4 ?C)   Ht $R'5\' 8"'lg$  (1.727 m)   Wt 131 lb 9.6 oz (59.7 kg)   SpO2 98%   BMI 20.01 kg/m?  ? ?   ?Assessment & Plan:  ? ?Casey Yang comes in today with chief complaint of Hypertension ? ? ?Diagnosis and orders addressed: ? ?1. Annual physical exam ?- CMP14+EGFR ?- CBC with Differential/Platelet ?- Lipid panel ?- PSA, total and free ?- TSH ? ?2. Primary hypertension ?- CMP14+EGFR ?- CBC with Differential/Platelet ?- amLODipine (NORVASC) 5 MG tablet; Take 1 tablet (5 mg total) by mouth daily.  Dispense: 90 tablet; Refill: 4 ? ?3. Mixed hyperlipidemia ?- CMP14+EGFR ?- CBC with Differential/Platelet ?- atorvastatin (LIPITOR) 20 MG tablet; Take 1 tablet (20 mg total) by mouth daily.  Dispense: 90 tablet; Refill: 3 ? ?4. Current smoker ?- CMP14+EGFR ?- CBC with Differential/Platelet ? ?5. Colon cancer screening ? ?- CMP14+EGFR ?- CBC with Differential/Platelet ?- Cologuard ? ? ?Labs pending ?Health Maintenance reviewed ?Diet and exercise encouraged ? ?Follow up plan: ?1 year  ? ? ?Evelina Dun, FNP ? ? ?

## 2021-04-30 NOTE — Patient Instructions (Signed)

## 2021-05-01 LAB — CBC WITH DIFFERENTIAL/PLATELET
Basophils Absolute: 0.1 10*3/uL (ref 0.0–0.2)
Basos: 2 %
EOS (ABSOLUTE): 0.1 10*3/uL (ref 0.0–0.4)
Eos: 1 %
Hematocrit: 31.4 % — ABNORMAL LOW (ref 37.5–51.0)
Hemoglobin: 9.2 g/dL — ABNORMAL LOW (ref 13.0–17.7)
Immature Grans (Abs): 0 10*3/uL (ref 0.0–0.1)
Immature Granulocytes: 1 %
Lymphocytes Absolute: 1.2 10*3/uL (ref 0.7–3.1)
Lymphs: 20 %
MCH: 19.6 pg — ABNORMAL LOW (ref 26.6–33.0)
MCHC: 29.3 g/dL — ABNORMAL LOW (ref 31.5–35.7)
MCV: 67 fL — ABNORMAL LOW (ref 79–97)
Monocytes Absolute: 0.9 10*3/uL (ref 0.1–0.9)
Monocytes: 15 %
Neutrophils Absolute: 3.7 10*3/uL (ref 1.4–7.0)
Neutrophils: 61 %
Platelets: 302 10*3/uL (ref 150–450)
RBC: 4.7 x10E6/uL (ref 4.14–5.80)
RDW: 19.3 % — ABNORMAL HIGH (ref 11.6–15.4)
WBC: 6 10*3/uL (ref 3.4–10.8)

## 2021-05-01 LAB — CMP14+EGFR
ALT: 15 IU/L (ref 0–44)
AST: 31 IU/L (ref 0–40)
Albumin/Globulin Ratio: 2.1 (ref 1.2–2.2)
Albumin: 4.5 g/dL (ref 3.8–4.9)
Alkaline Phosphatase: 60 IU/L (ref 44–121)
BUN/Creatinine Ratio: 11 (ref 9–20)
BUN: 11 mg/dL (ref 6–24)
Bilirubin Total: 0.2 mg/dL (ref 0.0–1.2)
CO2: 24 mmol/L (ref 20–29)
Calcium: 9.4 mg/dL (ref 8.7–10.2)
Chloride: 102 mmol/L (ref 96–106)
Creatinine, Ser: 0.98 mg/dL (ref 0.76–1.27)
Globulin, Total: 2.1 g/dL (ref 1.5–4.5)
Glucose: 77 mg/dL (ref 70–99)
Potassium: 4.3 mmol/L (ref 3.5–5.2)
Sodium: 138 mmol/L (ref 134–144)
Total Protein: 6.6 g/dL (ref 6.0–8.5)
eGFR: 93 mL/min/{1.73_m2} (ref >59)

## 2021-05-01 LAB — LIPID PANEL
Chol/HDL Ratio: 2.3 ratio (ref 0.0–5.0)
Cholesterol, Total: 151 mg/dL (ref 100–199)
HDL: 67 mg/dL (ref >39)
LDL Chol Calc (NIH): 73 mg/dL (ref 0–99)
Triglycerides: 53 mg/dL (ref 0–149)
VLDL Cholesterol Cal: 11 mg/dL (ref 5–40)

## 2021-05-01 LAB — PSA, TOTAL AND FREE
PSA, Free Pct: 18.5 %
PSA, Free: 0.74 ng/mL
Prostate Specific Ag, Serum: 4 ng/mL (ref 0.0–4.0)

## 2021-05-01 LAB — TSH: TSH: 0.535 u[IU]/mL (ref 0.450–4.500)

## 2021-05-06 ENCOUNTER — Encounter: Payer: Self-pay | Admitting: Family Medicine

## 2021-05-07 LAB — IRON AND TIBC
Iron Saturation: 2 % — CL (ref 15–55)
Iron: 10 ug/dL — ABNORMAL LOW (ref 38–169)
Total Iron Binding Capacity: 443 ug/dL (ref 250–450)
UIBC: 433 ug/dL — ABNORMAL HIGH (ref 111–343)

## 2021-05-07 LAB — B12 AND FOLATE PANEL
Folate: 14.9 ng/mL (ref >3.0)
Vitamin B-12: 556 pg/mL (ref 232–1245)

## 2021-05-07 LAB — SPECIMEN STATUS REPORT

## 2021-06-08 ENCOUNTER — Ambulatory Visit: Payer: BC Managed Care – PPO | Admitting: Family

## 2021-06-09 ENCOUNTER — Encounter: Payer: Self-pay | Admitting: Family

## 2021-06-26 ENCOUNTER — Encounter: Payer: BC Managed Care – PPO | Admitting: Family

## 2021-06-29 ENCOUNTER — Encounter: Payer: Self-pay | Admitting: Family

## 2022-04-08 ENCOUNTER — Encounter: Payer: Self-pay | Admitting: Radiology
